# Patient Record
Sex: Female | Born: 1935 | Race: White | Hispanic: No | Smoking: Never smoker
Health system: Southern US, Community
[De-identification: ages and names within clinical notes are randomized; demographics above are authoritative.]

## PROBLEM LIST (undated history)

## (undated) DIAGNOSIS — E785 Hyperlipidemia, unspecified: Secondary | ICD-10-CM

## (undated) DIAGNOSIS — E039 Hypothyroidism, unspecified: Secondary | ICD-10-CM

## (undated) HISTORY — PX: ABDOMINAL HYSTERECTOMY: SHX81

---

## 2018-12-03 ENCOUNTER — Emergency Department (HOSPITAL_BASED_OUTPATIENT_CLINIC_OR_DEPARTMENT_OTHER)
Admission: EM | Admit: 2018-12-03 | Discharge: 2018-12-03 | Disposition: A | Discharge status: Home / Self Care | Payer: Medicare HMO | Attending: Emergency Medicine | Admitting: Emergency Medicine

## 2018-12-03 ENCOUNTER — Encounter (HOSPITAL_BASED_OUTPATIENT_CLINIC_OR_DEPARTMENT_OTHER): Payer: Self-pay | Admitting: Emergency Medicine

## 2018-12-03 ENCOUNTER — Other Ambulatory Visit: Payer: Self-pay

## 2018-12-03 DIAGNOSIS — L03114 Cellulitis of left upper limb: Secondary | ICD-10-CM | POA: Diagnosis not present

## 2018-12-03 DIAGNOSIS — M79602 Pain in left arm: Secondary | ICD-10-CM | POA: Diagnosis present

## 2018-12-03 LAB — LACTIC ACID, PLASMA: Lactic Acid, Venous: 1.2 mmol/L (ref 0.5–1.9)

## 2018-12-03 LAB — CBC WITH DIFFERENTIAL/PLATELET
Abs Immature Granulocytes: 0.03 10*3/uL (ref 0.00–0.07)
Basophils Absolute: 0 10*3/uL (ref 0.0–0.1)
Basophils Relative: 0 %
Eosinophils Absolute: 0.7 10*3/uL — ABNORMAL HIGH (ref 0.0–0.5)
Eosinophils Relative: 8 %
HCT: 39.7 % (ref 36.0–46.0)
Hemoglobin: 12.8 g/dL (ref 12.0–15.0)
Immature Granulocytes: 0 %
Lymphocytes Relative: 38 %
Lymphs Abs: 3.6 10*3/uL (ref 0.7–4.0)
MCH: 29.6 pg (ref 26.0–34.0)
MCHC: 32.2 g/dL (ref 30.0–36.0)
MCV: 91.7 fL (ref 80.0–100.0)
Monocytes Absolute: 1.2 10*3/uL — ABNORMAL HIGH (ref 0.1–1.0)
Monocytes Relative: 13 %
Neutro Abs: 3.9 10*3/uL (ref 1.7–7.7)
Neutrophils Relative %: 41 %
Platelets: 148 10*3/uL — ABNORMAL LOW (ref 150–400)
RBC: 4.33 MIL/uL (ref 3.87–5.11)
RDW: 14.4 % (ref 11.5–15.5)
WBC: 9.4 10*3/uL (ref 4.0–10.5)
nRBC: 0 % (ref 0.0–0.2)

## 2018-12-03 LAB — BASIC METABOLIC PANEL
Anion gap: 10 (ref 5–15)
BUN: 14 mg/dL (ref 8–23)
CO2: 25 mmol/L (ref 22–32)
Calcium: 8.7 mg/dL — ABNORMAL LOW (ref 8.9–10.3)
Chloride: 102 mmol/L (ref 98–111)
Creatinine, Ser: 1 mg/dL (ref 0.44–1.00)
GFR calc Af Amer: 58 mL/min — ABNORMAL LOW (ref >60)
GFR calc non Af Amer: 50 mL/min — ABNORMAL LOW (ref >60)
Glucose, Bld: 110 mg/dL — ABNORMAL HIGH (ref 70–99)
Potassium: 3.6 mmol/L (ref 3.5–5.1)
Sodium: 137 mmol/L (ref 135–145)

## 2018-12-03 MED ORDER — DOXYCYCLINE HYCLATE 100 MG PO TABS
100.0000 mg | ORAL_TABLET | Freq: Once | ORAL | Status: AC
  Administered 2018-12-03: 16:00:00 100 mg via ORAL
  Filled 2018-12-03: qty 1

## 2018-12-03 MED ORDER — DOXYCYCLINE HYCLATE 100 MG PO CAPS: 100.0000 mg | ORAL_CAPSULE | Freq: Two times a day (BID) | ORAL | 0 refills | Status: DC

## 2018-12-03 NOTE — Discharge Instructions (Addendum)
If you develop fever, vomiting, worsening or severe pain, or any other new/concerning symptoms then return to the ER for evaluation.  Otherwise follow-up closely with your primary care physician.

## 2018-12-03 NOTE — ED Notes (Signed)
ED Provider at bedside. 

## 2018-12-03 NOTE — ED Provider Notes (Signed)
[facility] EMERGENCY DEPARTMENT Provider Note   CSN: [id number] Arrival date & time: 12/03/18  1422    History   Chief Complaint Chief Complaint  Patient presents with  . Arm Injury    HPI Madison Chen is a 85 y.o. female.     HPI  85 year old female presents with left arm redness. She fell and injured her arm with an abrasion to her lower upper arm on the left side about a week ago.  Picked at a scab.  Over the last couple days has developed redness, increased warmth and itching.  She has noticed swelling as well.  It is not particularly painful unless palpated.  There is no fever or vomiting.  No chest pain or shortness of breath.  She put some Neosporin on it last night which she thinks might of helped.  History reviewed. No pertinent past medical history.  There are no active problems to display for this patient.   Past Surgical History:  Procedure Laterality Date  . ABDOMINAL HYSTERECTOMY       OB History   No obstetric history on file.      Home Medications    Prior to Admission medications   Medication Sig Start Date End Date Taking? Authorizing Provider  doxycycline (VIBRAMYCIN) 100 MG capsule Take 1 capsule (100 mg total) by mouth 2 (two) times daily. 12/03/18   Pricilla LovelessGoldston, Alastor Kneale, MD    Family History No family history on file.  Social History Social History   Tobacco Use  . Smoking status: Never Smoker  . Smokeless tobacco: Never Used  Substance Use Topics  . Alcohol use: Not Currently  . Drug use: Not on file     Allergies   Sulfa antibiotics   Review of Systems Review of Systems  Constitutional: Negative for fever.  Respiratory: Negative for shortness of breath.   Cardiovascular: Negative for chest pain.  Gastrointestinal: Negative for vomiting.  Neurological: Negative for weakness and numbness.  All other systems reviewed and are negative.    Physical Exam Updated Vital Signs BP 133/83 (BP Location: Right Arm)   Pulse 83    Temp 98.4 F (36.9 C) (Oral)   Resp 18   Ht 5\' 7"  (1.702 m)   Wt 56.7 kg   SpO2 99%   BMI 19.58 kg/m   Physical Exam Vitals signs and nursing note reviewed.  Constitutional:      Appearance: She is well-developed.  HENT:     Head: Normocephalic and atraumatic.     Right Ear: External ear normal.     Left Ear: External ear normal.     Nose: Nose normal.  Eyes:     General:        Right eye: No discharge.        Left eye: No discharge.  Cardiovascular:     Rate and Rhythm: Normal rate and regular rhythm.     Pulses:          Radial pulses are 2+ on the left side.     Heart sounds: Normal heart sounds.  Pulmonary:     Effort: Pulmonary effort is normal.     Breath sounds: Normal breath sounds.  Abdominal:     General: There is no distension.  Skin:    General: Skin is warm and dry.     Findings: Erythema present.     Comments: See picture. No significant tenderness. Mild warmth compared to other arm. Normal strength/sensation. Normal ROM of elbow,  wrist.  Neurological:     Mental Status: She is alert.  Psychiatric:        Mood and Affect: Mood is not anxious.        ED Treatments / Results  Labs (all labs ordered are listed, but only abnormal results are displayed) Labs Reviewed  BASIC METABOLIC PANEL - Abnormal; Notable for the following components:      Result Value   Glucose, Bld 110 (*)    Calcium 8.7 (*)    GFR calc non Af Amer 50 (*)    GFR calc Af Amer 58 (*)    All other components within normal limits  CBC WITH DIFFERENTIAL/PLATELET - Abnormal; Notable for the following components:   Platelets 148 (*)    Monocytes Absolute 1.2 (*)    Eosinophils Absolute 0.7 (*)    All other components within normal limits  LACTIC ACID, PLASMA    EKG None  Radiology No results found.  Procedures Procedures (including critical care time)  Medications Ordered in ED Medications  doxycycline (VIBRA-TABS) tablet 100 mg (100 mg Oral Given 12/03/18 1609)      Initial Impression / Assessment and Plan / ED Course  I have reviewed the triage vital signs and the nursing notes.  Pertinent labs & imaging results that were available during my care of the patient were reviewed by me and considered in my medical decision making (see chart for details).        Given the degree of redness and her age, screening labs obtained but did not show any signs of sepsis.  She will be given doxycycline to help cover for most likely pathogens.  Advised of strict return precautions and otherwise to follow-up with her PCP for wound recheck but otherwise appears stable for outpatient management.  Given the direct correlation from an abrasion/wound to this redness, I think this is most likely cellulitis.  I think DVT is pretty unlikely, especially given the demarcation.  Final Clinical Impressions(s) / ED Diagnoses   Final diagnoses:  Left arm cellulitis    ED Discharge Orders         Ordered    doxycycline (VIBRAMYCIN) 100 MG capsule  2 times daily     12/03/18 1610           Sherwood Gambler, MD 12/03/18 1611

## 2018-12-03 NOTE — ED Triage Notes (Signed)
She states she fell one week ago and had a skin tear to L upper arm. The skin tear healed and now has swelling, redness, and itching x 2 days.

## 2020-04-25 ENCOUNTER — Emergency Department (HOSPITAL_BASED_OUTPATIENT_CLINIC_OR_DEPARTMENT_OTHER): Payer: Medicare HMO

## 2020-04-25 ENCOUNTER — Encounter (HOSPITAL_BASED_OUTPATIENT_CLINIC_OR_DEPARTMENT_OTHER): Payer: Self-pay | Admitting: *Deleted

## 2020-04-25 ENCOUNTER — Emergency Department (HOSPITAL_BASED_OUTPATIENT_CLINIC_OR_DEPARTMENT_OTHER)
Admission: EM | Admit: 2020-04-25 | Discharge: 2020-04-25 | Disposition: A | Discharge status: Home / Self Care | Payer: Medicare HMO | Attending: Emergency Medicine | Admitting: Emergency Medicine

## 2020-04-25 ENCOUNTER — Other Ambulatory Visit: Payer: Self-pay

## 2020-04-25 DIAGNOSIS — W0110XA Fall on same level from slipping, tripping and stumbling with subsequent striking against unspecified object, initial encounter: Secondary | ICD-10-CM | POA: Insufficient documentation

## 2020-04-25 DIAGNOSIS — Y92009 Unspecified place in unspecified non-institutional (private) residence as the place of occurrence of the external cause: Secondary | ICD-10-CM | POA: Diagnosis not present

## 2020-04-25 DIAGNOSIS — S0101XA Laceration without foreign body of scalp, initial encounter: Secondary | ICD-10-CM

## 2020-04-25 DIAGNOSIS — S0990XA Unspecified injury of head, initial encounter: Secondary | ICD-10-CM | POA: Diagnosis present

## 2020-04-25 MED ORDER — LIDOCAINE-EPINEPHRINE 2 %-1:100000 IJ SOLN: 20.0000 mL | Freq: Once | INTRAMUSCULAR | Status: DC

## 2020-04-25 MED ORDER — LIDOCAINE-EPINEPHRINE 1 %-1:100000 IJ SOLN
INTRAMUSCULAR | Status: AC
  Administered 2020-04-25: 1 mL
  Filled 2020-04-25: qty 1

## 2020-04-25 NOTE — ED Notes (Signed)
Wound irrigation performed,  Pt tolerated well.  Dr Stevie Kern at bedside for laceration repair

## 2020-04-25 NOTE — Discharge Instructions (Addendum)
Follow-up with your primary doctor in 1 week for wound recheck and suture removal.  She may also return to ER for wound recheck and suture removal.  If you develop redness, drainage, fever, return to ER for wound recheck at that time.  If you develop severe headache, nausea, vomiting, confusion, additionally recommend return to ER.

## 2020-04-25 NOTE — ED Provider Notes (Signed)
[facility] EMERGENCY DEPARTMENT Provider Note   CSN: [id number] Arrival date & time: 04/25/20  1243     History Chief Complaint  Patient presents with  . Fall  . Laceration    Madison Chen is a 86 y.o. female.  Presents to ER with concern for fall, scalp laceration.  Patient reports that she was at home when she lost her balance and fell backwards hitting her head.  Unsure what she hit her head on.  Did not lose consciousness, denies any other injury.  No pain at present.  Notable medical history reports she had subdural hematoma 6 years ago.  Not on blood thinners.  HPI     History reviewed. No pertinent past medical history.  There are no problems to display for this patient.   Past Surgical History:  Procedure Laterality Date  . ABDOMINAL HYSTERECTOMY       OB History   No obstetric history on file.     No family history on file.  Social History   Tobacco Use  . Smoking status: Never Smoker  . Smokeless tobacco: Never Used  Substance Use Topics  . Alcohol use: Not Currently  . Drug use: Never    Home Medications Prior to Admission medications   Medication Sig Start Date End Date Taking? Authorizing Provider  doxycycline (VIBRAMYCIN) 100 MG capsule Take 1 capsule (100 mg total) by mouth 2 (two) times daily. 12/03/18   Pricilla Loveless, MD    Allergies    Sulfa antibiotics  Review of Systems   Review of Systems  Constitutional: Negative for chills and fever.  HENT: Negative for ear pain and sore throat.   Eyes: Negative for pain and visual disturbance.  Respiratory: Negative for cough and shortness of breath.   Cardiovascular: Negative for chest pain and palpitations.  Gastrointestinal: Negative for abdominal pain and vomiting.  Genitourinary: Negative for dysuria and hematuria.  Musculoskeletal: Negative for arthralgias and back pain.  Skin: Positive for wound. Negative for color change and rash.  Neurological: Negative for seizures,  syncope and headaches.  All other systems reviewed and are negative.   Physical Exam Updated Vital Signs BP (!) 141/75 (BP Location: Right Arm)   Pulse 70   Temp 98.8 F (37.1 C) (Oral)   Resp 18   Ht 5\' 7"  (1.702 m)   Wt 58.5 kg   SpO2 100%   BMI 20.20 kg/m   Physical Exam Vitals and nursing note reviewed.  Constitutional:      General: She is not in acute distress.    Appearance: She is well-developed and well-nourished.  HENT:     Head: Normocephalic.     Comments: Laceration to posterior occiput 3 cm in length, no active bleeding Eyes:     Conjunctiva/sclera: Conjunctivae normal.  Cardiovascular:     Rate and Rhythm: Normal rate and regular rhythm.     Heart sounds: No murmur heard.   Pulmonary:     Effort: Pulmonary effort is normal. No respiratory distress.     Breath sounds: Normal breath sounds.  Abdominal:     Palpations: Abdomen is soft.     Tenderness: There is no abdominal tenderness.  Musculoskeletal:        General: No swelling, tenderness, deformity, signs of injury or edema.     Cervical back: Neck supple.  Skin:    General: Skin is warm and dry.     Capillary Refill: Capillary refill takes less than 2 seconds.  Neurological:  General: No focal deficit present.     Mental Status: She is alert and oriented to person, place, and time.  Psychiatric:        Mood and Affect: Mood and affect normal.     ED Results / Procedures / Treatments   Labs (all labs ordered are listed, but only abnormal results are displayed) Labs Reviewed - No data to display  EKG None  Radiology No results found.  Procedures .Marland KitchenLaceration Repair  Date/Time: 04/26/2020 12:16 AM Performed by: Milagros Loll, MD Authorized by: Milagros Loll, MD   Consent:    Consent obtained:  Verbal   Consent given by:  Patient   Risks, benefits, and alternatives were discussed: yes     Risks discussed:  Infection, need for additional repair and nerve damage    Alternatives discussed:  No treatment Anesthesia:    Anesthesia method:  Local infiltration   Local anesthetic:  Lidocaine 2% WITH epi Laceration details:    Location:  Scalp   Scalp location:  Occipital   Length (cm):  3 Treatment:    Area cleansed with:  Saline   Amount of cleaning:  Extensive   Irrigation solution:  Sterile saline   Irrigation method:  Syringe   Visualized foreign bodies/material removed: no     Debridement:  None Skin repair:    Repair method:  Staples   Number of staples:  4 Approximation:    Approximation:  Close Repair type:    Repair type:  Simple Post-procedure details:    Dressing:  Open (no dressing)   Procedure completion:  Tolerated well, no immediate complications   (including critical care time)  Medications Ordered in ED Medications  lidocaine-EPINEPHrine (XYLOCAINE W/EPI) 2 %-1:100000 (with pres) injection 20 mL (20 mLs Infiltration Not Given 04/25/20 1651)  lidocaine-EPINEPHrine (XYLOCAINE W/EPI) 1 %-1:100000 (with pres) injection (1 mL  Given by Other 04/25/20 1651)    ED Course  I have reviewed the triage vital signs and the nursing notes.  Pertinent labs & imaging results that were available during my care of the patient were reviewed by me and considered in my medical decision making (see chart for details).    MDM Rules/Calculators/A&P                           86 year old lady fell and hit her head.  No loss of consciousness.  Well-appearing in ER.  Noted 3 cm laceration of scalp.  4 staples, tolerated well.  Follow-up in 1 week with primary doctor or return here for wound recheck and suture removal.  Final Clinical Impression(s) / ED Diagnoses Final diagnoses:  None    Rx / DC Orders ED Discharge Orders    None       Milagros Loll, MD 04/26/20 [phone]

## 2020-04-25 NOTE — ED Triage Notes (Signed)
She lost her balance and fell today. Scalp laceration. No LOC. No blood thinners. 6 years ago she had a subdural hematoma after hitting her head.

## 2020-04-25 NOTE — ED Notes (Signed)
Patient transported to CT 

## 2020-05-08 ENCOUNTER — Emergency Department (HOSPITAL_BASED_OUTPATIENT_CLINIC_OR_DEPARTMENT_OTHER): Payer: Medicare HMO

## 2020-05-08 ENCOUNTER — Encounter (HOSPITAL_BASED_OUTPATIENT_CLINIC_OR_DEPARTMENT_OTHER): Payer: Self-pay

## 2020-05-08 ENCOUNTER — Other Ambulatory Visit: Payer: Self-pay

## 2020-05-08 ENCOUNTER — Emergency Department (HOSPITAL_BASED_OUTPATIENT_CLINIC_OR_DEPARTMENT_OTHER)
Admission: EM | Admit: 2020-05-08 | Discharge: 2020-05-08 | Disposition: A | Discharge status: Home / Self Care | Payer: Medicare HMO | Attending: Emergency Medicine | Admitting: Emergency Medicine

## 2020-05-08 DIAGNOSIS — Z20822 Contact with and (suspected) exposure to covid-19: Secondary | ICD-10-CM

## 2020-05-08 DIAGNOSIS — R5383 Other fatigue: Secondary | ICD-10-CM | POA: Diagnosis present

## 2020-05-08 DIAGNOSIS — U071 COVID-19: Secondary | ICD-10-CM | POA: Insufficient documentation

## 2020-05-08 LAB — SARS CORONAVIRUS 2 (TAT 6-24 HRS): SARS Coronavirus 2: POSITIVE — AB

## 2020-05-08 NOTE — Discharge Instructions (Signed)
You were seen today in the emergency department with fever and fatigue.  We are sending a Covid test and this will come back in the next 6 to 24 hours.  The test results will be available in the MyChart app.  You can set this up on your phone with instructions included in this paperwork.  You would also be called with a positive test result.  If positive, the infusion center will reach out to discuss if you are a candidate to receive the monoclonal antibody therapy.  You can also discuss this with your primary care doctor.  Return to the emergency department any new or suddenly worsening symptoms.

## 2020-05-08 NOTE — ED Notes (Signed)
When RN went into room to obtain blood pt states she wanted a covid test and to go home. This RN spoke with pts son who stated he brought pt to ER to be tested. MD aware.

## 2020-05-08 NOTE — ED Triage Notes (Signed)
Pt reports fever and fatigue at home x 2-3 days.

## 2020-05-08 NOTE — ED Provider Notes (Signed)
Emergency Department Provider Note   I have reviewed the triage vital signs and the nursing notes.   HISTORY  Chief Complaint Fatigue   HPI Madison Chen is a 87 y.o. female with past medical history reviewed below including recent ED visit with head injury requiring laceration repair presents with fatigue, fevers for the past 3 days.  Patient describes mainly fever at night which improves with Tylenol.  She has been feeling very fatigued with some body aches.  She notes some mild posterior headache but states overall her symptoms after the fall have improved.  The wound has not been bleeding or draining.  She denies any shortness of breath, respiratory symptoms, sore throat, congestion.  No abdominal pain, vomiting, diarrhea.  Denies any dysuria, hesitancy, urgency.  No sick contacts.  History reviewed. No pertinent past medical history.  There are no problems to display for this patient.   Past Surgical History:  Procedure Laterality Date  . ABDOMINAL HYSTERECTOMY      Allergies Sulfa antibiotics  History reviewed. No pertinent family history.  Social History Social History   Tobacco Use  . Smoking status: Never Smoker  . Smokeless tobacco: Never Used  Substance Use Topics  . Alcohol use: Not Currently  . Drug use: Never    Review of Systems  Constitutional: Positive fever/chills Eyes: No visual changes. ENT: No sore throat. Cardiovascular: Denies chest pain. Respiratory: Denies shortness of breath. Gastrointestinal: No abdominal pain.  No nausea, no vomiting.  No diarrhea.  No constipation. Genitourinary: Negative for dysuria. Musculoskeletal: Negative for back pain. Skin: Negative for rash. Neurological: Negative for focal weakness or numbness. Mild posterior HA (improved)  10-point ROS otherwise negative.  ____________________________________________   PHYSICAL EXAM:  VITAL SIGNS: ED Triage Vitals  Enc Vitals Group     BP 05/08/20 1045 105/67      Pulse Rate 05/08/20 1045 95     Resp 05/08/20 1045 18     Temp 05/08/20 1045 97.8 F (36.6 C)     Temp Source 05/08/20 1045 Oral     SpO2 05/08/20 1045 97 %     Weight 05/08/20 1046 125 lb (56.7 kg)     Height 05/08/20 1046 5\' 6"  (1.676 m)   Constitutional: Alert and oriented. Well appearing and in no acute distress. Eyes: Conjunctivae are normal. PERRL Head: Well appearing laceration to the right occipital scalp.  No surrounding cellulitis.  No residual hematoma. Staples removed.  Nose: No congestion/rhinnorhea. Mouth/Throat: Mucous membranes are moist.  Neck: No stridor.   Cardiovascular: Normal rate, regular rhythm. Good peripheral circulation. Grossly normal heart sounds.   Respiratory: Normal respiratory effort.  No retractions. Lungs CTAB. Gastrointestinal: Soft and nontender. No distention.  Musculoskeletal: No lower extremity tenderness nor edema. No gross deformities of extremities. Neurologic:  Normal speech and language. No gross focal neurologic deficits are appreciated.  Skin:  Skin is warm, dry and intact. No rash noted.   ____________________________________________   LABS (all labs ordered are listed, but only abnormal results are displayed)  Labs Reviewed  SARS CORONAVIRUS 2 (TAT 6-24 HRS) - Abnormal; Notable for the following components:      Result Value   SARS Coronavirus 2 POSITIVE (*)    All other components within normal limits   ____________________________________________   PROCEDURES  Procedure(s) performed:   Procedures  None  ____________________________________________   INITIAL IMPRESSION / ASSESSMENT AND PLAN / ED COURSE  Pertinent labs & imaging results that were available during my care of the patient  were reviewed by me and considered in my medical decision making (see chart for details).   Patient presents to the emergency department with fatigue and fever.  The wound on her scalp is very well-appearing and is healing as I would  expect.  Does not appear infected.  Patient has a normal neurologic exam.  She sitting on the edge of the bed and conversational without obvious confusion.  My suspicion for delayed bleed causing symptoms in this case is exceedingly low and I do not feel she needs additional CT imaging of the head at this time.  COVID-19 is a consideration along with other viral infections.  Vital signs are not consistent with sepsis.  Plan for screening lab work, UA, chest x-ray, Covid test, and reassess.   After further discussion, patient refusing additional lab work.  She states that she is mainly concerned about COVID-19 given possible exposure.  She does not want to have blood or urine testing.  Refusing chest x-ray.  We will swab for Covid and have her follow the test results in the MyChart app.  Discussed that if she feels any worse she should return to the emergency department any new or suddenly worsening symptoms.  ____________________________________________  FINAL CLINICAL IMPRESSION(S) / ED DIAGNOSES  Final diagnoses:  Suspected COVID-19 virus infection     Note:  This document was prepared using Dragon voice recognition software and may include unintentional dictation errors.  Alona Bene, MD, [facility]    Benett Swoyer, Arlyss Repress, MD 05/10/20 [phone]

## 2020-08-19 ENCOUNTER — Other Ambulatory Visit: Payer: Self-pay

## 2020-08-19 ENCOUNTER — Emergency Department (HOSPITAL_BASED_OUTPATIENT_CLINIC_OR_DEPARTMENT_OTHER): Payer: Medicare HMO

## 2020-08-19 ENCOUNTER — Inpatient Hospital Stay (HOSPITAL_BASED_OUTPATIENT_CLINIC_OR_DEPARTMENT_OTHER)
Admission: EM | Admit: 2020-08-19 | Discharge: 2020-08-24 | DRG: 193 | Disposition: A | Discharge status: Skilled Nursing Facility | Payer: Medicare HMO | Attending: Internal Medicine | Admitting: Internal Medicine

## 2020-08-19 ENCOUNTER — Encounter (HOSPITAL_BASED_OUTPATIENT_CLINIC_OR_DEPARTMENT_OTHER): Payer: Self-pay | Admitting: Radiology

## 2020-08-19 DIAGNOSIS — Z87440 Personal history of urinary (tract) infections: Secondary | ICD-10-CM | POA: Diagnosis not present

## 2020-08-19 DIAGNOSIS — R6520 Severe sepsis without septic shock: Secondary | ICD-10-CM | POA: Diagnosis not present

## 2020-08-19 DIAGNOSIS — E039 Hypothyroidism, unspecified: Secondary | ICD-10-CM

## 2020-08-19 DIAGNOSIS — E785 Hyperlipidemia, unspecified: Secondary | ICD-10-CM

## 2020-08-19 DIAGNOSIS — R531 Weakness: Secondary | ICD-10-CM

## 2020-08-19 DIAGNOSIS — Z888 Allergy status to other drugs, medicaments and biological substances status: Secondary | ICD-10-CM

## 2020-08-19 DIAGNOSIS — E876 Hypokalemia: Secondary | ICD-10-CM | POA: Diagnosis present

## 2020-08-19 DIAGNOSIS — N179 Acute kidney failure, unspecified: Secondary | ICD-10-CM

## 2020-08-19 DIAGNOSIS — R410 Disorientation, unspecified: Secondary | ICD-10-CM | POA: Diagnosis not present

## 2020-08-19 DIAGNOSIS — Z882 Allergy status to sulfonamides status: Secondary | ICD-10-CM | POA: Diagnosis not present

## 2020-08-19 DIAGNOSIS — J189 Pneumonia, unspecified organism: Secondary | ICD-10-CM

## 2020-08-19 DIAGNOSIS — D696 Thrombocytopenia, unspecified: Secondary | ICD-10-CM | POA: Diagnosis present

## 2020-08-19 DIAGNOSIS — Z20822 Contact with and (suspected) exposure to covid-19: Secondary | ICD-10-CM | POA: Diagnosis present

## 2020-08-19 DIAGNOSIS — A419 Sepsis, unspecified organism: Secondary | ICD-10-CM | POA: Diagnosis not present

## 2020-08-19 DIAGNOSIS — G9341 Metabolic encephalopathy: Secondary | ICD-10-CM

## 2020-08-19 DIAGNOSIS — D649 Anemia, unspecified: Secondary | ICD-10-CM | POA: Diagnosis present

## 2020-08-19 DIAGNOSIS — Z79890 Hormone replacement therapy (postmenopausal): Secondary | ICD-10-CM | POA: Diagnosis not present

## 2020-08-19 DIAGNOSIS — Z90710 Acquired absence of both cervix and uterus: Secondary | ICD-10-CM | POA: Diagnosis not present

## 2020-08-19 DIAGNOSIS — R7989 Other specified abnormal findings of blood chemistry: Secondary | ICD-10-CM | POA: Diagnosis present

## 2020-08-19 DIAGNOSIS — J180 Bronchopneumonia, unspecified organism: Secondary | ICD-10-CM | POA: Diagnosis present

## 2020-08-19 DIAGNOSIS — M609 Myositis, unspecified: Secondary | ICD-10-CM | POA: Diagnosis present

## 2020-08-19 DIAGNOSIS — M6282 Rhabdomyolysis: Secondary | ICD-10-CM | POA: Diagnosis present

## 2020-08-19 DIAGNOSIS — N390 Urinary tract infection, site not specified: Secondary | ICD-10-CM

## 2020-08-19 DIAGNOSIS — Z8616 Personal history of COVID-19: Secondary | ICD-10-CM | POA: Diagnosis not present

## 2020-08-19 DIAGNOSIS — R8281 Pyuria: Secondary | ICD-10-CM | POA: Diagnosis not present

## 2020-08-19 DIAGNOSIS — E860 Dehydration: Secondary | ICD-10-CM | POA: Diagnosis present

## 2020-08-19 DIAGNOSIS — R8271 Bacteriuria: Secondary | ICD-10-CM | POA: Diagnosis not present

## 2020-08-19 HISTORY — DX: Hypothyroidism, unspecified: E03.9

## 2020-08-19 HISTORY — DX: Hyperlipidemia, unspecified: E78.5

## 2020-08-19 LAB — CBC WITH DIFFERENTIAL/PLATELET
Abs Immature Granulocytes: 0.26 10*3/uL — ABNORMAL HIGH (ref 0.00–0.07)
Basophils Absolute: 0.1 10*3/uL (ref 0.0–0.1)
Basophils Relative: 0 %
Eosinophils Absolute: 0.1 10*3/uL (ref 0.0–0.5)
Eosinophils Relative: 1 %
HCT: 38.6 % (ref 36.0–46.0)
Hemoglobin: 13.1 g/dL (ref 12.0–15.0)
Immature Granulocytes: 1 %
Lymphocytes Relative: 6 %
Lymphs Abs: 1.8 10*3/uL (ref 0.7–4.0)
MCH: 30.6 pg (ref 26.0–34.0)
MCHC: 33.9 g/dL (ref 30.0–36.0)
MCV: 90.2 fL (ref 80.0–100.0)
Monocytes Absolute: 0.8 10*3/uL (ref 0.1–1.0)
Monocytes Relative: 3 %
Neutro Abs: 24.9 10*3/uL — ABNORMAL HIGH (ref 1.7–7.7)
Neutrophils Relative %: 89 %
Platelets: 103 10*3/uL — ABNORMAL LOW (ref 150–400)
RBC: 4.28 MIL/uL (ref 3.87–5.11)
RDW: 14.8 % (ref 11.5–15.5)
Smear Review: DECREASED
WBC Morphology: INCREASED
WBC: 28 10*3/uL — ABNORMAL HIGH (ref 4.0–10.5)
nRBC: 0 % (ref 0.0–0.2)

## 2020-08-19 LAB — COMPREHENSIVE METABOLIC PANEL
ALT: 36 U/L (ref 0–44)
AST: 66 U/L — ABNORMAL HIGH (ref 15–41)
Albumin: 3.6 g/dL (ref 3.5–5.0)
Alkaline Phosphatase: 89 U/L (ref 38–126)
Anion gap: 12 (ref 5–15)
BUN: 28 mg/dL — ABNORMAL HIGH (ref 8–23)
CO2: 23 mmol/L (ref 22–32)
Calcium: 8.6 mg/dL — ABNORMAL LOW (ref 8.9–10.3)
Chloride: 98 mmol/L (ref 98–111)
Creatinine, Ser: 1.51 mg/dL — ABNORMAL HIGH (ref 0.44–1.00)
GFR, Estimated: 32 mL/min — ABNORMAL LOW (ref >60)
Glucose, Bld: 218 mg/dL — ABNORMAL HIGH (ref 70–99)
Potassium: 3.5 mmol/L (ref 3.5–5.1)
Sodium: 133 mmol/L — ABNORMAL LOW (ref 135–145)
Total Bilirubin: 0.9 mg/dL (ref 0.3–1.2)
Total Protein: 6.8 g/dL (ref 6.5–8.1)

## 2020-08-19 LAB — URINALYSIS, ROUTINE W REFLEX MICROSCOPIC
Bilirubin Urine: NEGATIVE
Glucose, UA: NEGATIVE mg/dL
Ketones, ur: NEGATIVE mg/dL
Nitrite: NEGATIVE
Protein, ur: 30 mg/dL — AB
Specific Gravity, Urine: 1.02 (ref 1.005–1.030)
pH: 6 (ref 5.0–8.0)

## 2020-08-19 LAB — URINALYSIS, MICROSCOPIC (REFLEX): WBC, UA: >50 WBC/hpf (ref 0–5)

## 2020-08-19 LAB — MAGNESIUM: Magnesium: 2 mg/dL (ref 1.7–2.4)

## 2020-08-19 LAB — TROPONIN I (HIGH SENSITIVITY)
Troponin I (High Sensitivity): 31 ng/L — ABNORMAL HIGH (ref <18)
Troponin I (High Sensitivity): 26 ng/L — ABNORMAL HIGH (ref <18)

## 2020-08-19 LAB — CK: Total CK: 1024 U/L — ABNORMAL HIGH (ref 38–234)

## 2020-08-19 MED ORDER — SODIUM CHLORIDE 0.9 % IV BOLUS
30.0000 mL/kg | Freq: Once | INTRAVENOUS | Status: AC
Start: 2020-08-19 — End: 2020-08-19
  Administered 2020-08-19: 1207 mL via INTRAVENOUS

## 2020-08-19 MED ORDER — SODIUM CHLORIDE 0.9 % IV SOLN
2.0000 g | INTRAVENOUS | Status: DC
  Administered 2020-08-20 – 2020-08-21 (×2): 2 g via INTRAVENOUS
  Filled 2020-08-19: qty 2
  Filled 2020-08-19 (×2): qty 20

## 2020-08-19 MED ORDER — DOXYCYCLINE HYCLATE 100 MG PO TABS
100.0000 mg | ORAL_TABLET | Freq: Once | ORAL | Status: AC
  Administered 2020-08-19: 100 mg via ORAL
  Filled 2020-08-19: qty 1

## 2020-08-19 MED ORDER — SODIUM CHLORIDE 0.9 % IV BOLUS
500.0000 mL | Freq: Once | INTRAVENOUS | Status: AC
  Administered 2020-08-19: 500 mL via INTRAVENOUS

## 2020-08-19 MED ORDER — SODIUM CHLORIDE 0.9 % IV SOLN: INTRAVENOUS | Status: DC

## 2020-08-19 MED ORDER — SODIUM CHLORIDE 0.9 % IV SOLN
1.0000 g | Freq: Once | INTRAVENOUS | Status: AC
  Administered 2020-08-19: 1 g via INTRAVENOUS
  Filled 2020-08-19: qty 10

## 2020-08-19 MED ORDER — IOHEXOL 300 MG/ML  SOLN
100.0000 mL | Freq: Once | INTRAMUSCULAR | Status: AC | PRN
  Administered 2020-08-19: 80 mL via INTRAVENOUS

## 2020-08-19 MED ORDER — ACETAMINOPHEN 325 MG PO TABS
650.0000 mg | ORAL_TABLET | Freq: Four times a day (QID) | ORAL | Status: DC | PRN
  Administered 2020-08-20 – 2020-08-22 (×3): 650 mg via ORAL
  Filled 2020-08-19 (×4): qty 2

## 2020-08-19 MED ORDER — SODIUM CHLORIDE 0.9 % IV SOLN
100.0000 mg | Freq: Two times a day (BID) | INTRAVENOUS | Status: DC
  Administered 2020-08-19 – 2020-08-22 (×6): 100 mg via INTRAVENOUS
  Filled 2020-08-19 (×6): qty 100

## 2020-08-19 MED ORDER — ACETAMINOPHEN 650 MG RE SUPP: 650.0000 mg | Freq: Four times a day (QID) | RECTAL | Status: DC | PRN

## 2020-08-19 NOTE — ED Notes (Signed)
Son Mellody Dance: [id number]

## 2020-08-19 NOTE — ED Provider Notes (Signed)
[facility] EMERGENCY DEPARTMENT Provider Note   CSN: [id number] Arrival date & time: 08/19/20  1044     History Chief Complaint  Patient presents with  . Weakness  . Altered Mental Status    Madison Chen is a 87 y.o. female.  Madison Chen was diagnosed with COVID-19 in February of this year.  Madison Chen had a left-sided lung infiltrate.  Madison Chen had an equivocal urinalysis and was treated for a possible UTI.  Symptoms today are similar to those Madison Chen had when diagnosed with COVID-19 earlier this year.  The history is provided by the patient and a relative (son).  Weakness Severity:  Moderate Onset quality:  Gradual Duration:  3 days Timing:  Constant Progression:  Worsening Chronicity:  New Context: not change in medication and not recent infection   Relieved by:  Nothing Worsened by:  Nothing Ineffective treatments:  None tried Associated symptoms: difficulty walking (due to weakness)   Associated symptoms: no abdominal pain, no anorexia, no aphasia, no arthralgias, no ataxia, no chest pain, no cough, no diarrhea, no dizziness, no dysuria, no fever, no seizures, no shortness of breath, no stroke symptoms, no urgency, no vision change and no vomiting   Altered Mental Status Associated symptoms: weakness   Associated symptoms: no abdominal pain, no fever, no palpitations, no rash, no seizures, no visual change and no vomiting        No past medical history on file.  There are no problems to display for this patient.   Past Surgical History:  Procedure Laterality Date  . ABDOMINAL HYSTERECTOMY       OB History   No obstetric history on file.     No family history on file.  Social History   Tobacco Use  . Smoking status: Never Smoker  . Smokeless tobacco: Never Used  Substance Use Topics  . Alcohol use: Not Currently  . Drug use: Never    Home Medications Prior to Admission medications   Medication Sig Start Date End Date Taking? Authorizing Provider   atorvastatin (LIPITOR) 10 MG tablet Take 1 tablet by mouth daily. 08/15/20   [provider]  doxycycline (VIBRAMYCIN) 100 MG capsule Take 1 capsule (100 mg total) by mouth 2 (two) times daily. 12/03/18   Pricilla Loveless, MD    Allergies    Sulfa antibiotics  Review of Systems   Review of Systems  Constitutional: Negative for chills and fever.  HENT: Negative for ear pain and sore throat.   Eyes: Negative for pain and visual disturbance.  Respiratory: Negative for cough and shortness of breath.   Cardiovascular: Negative for chest pain and palpitations.  Gastrointestinal: Negative for abdominal pain, anorexia, diarrhea and vomiting.  Genitourinary: Negative for dysuria, hematuria and urgency.  Musculoskeletal: Negative for arthralgias and back pain.  Skin: Negative for color change and rash.  Neurological: Positive for weakness. Negative for dizziness, seizures and syncope.       Confusion: some forgetfulness over the past few days in regards to short term memory  All other systems reviewed and are negative.   Physical Exam Updated Vital Signs BP (!) 101/59 (BP Location: Left Arm)   Pulse 80   Temp 97.8 F (36.6 C) (Oral)   Resp 15   Ht 5\' 6"  (1.676 m)   Wt 56.8 kg   SpO2 96%   BMI 20.21 kg/m   Physical Exam Vitals and nursing note reviewed.  Constitutional:      General: Madison Chen is not in acute distress.  Appearance: Madison Chen is well-developed.  HENT:     Head: Normocephalic and atraumatic.  Eyes:     Conjunctiva/sclera: Conjunctivae normal.  Cardiovascular:     Rate and Rhythm: Normal rate and regular rhythm.     Heart sounds: No murmur heard.   Pulmonary:     Effort: Pulmonary effort is normal. No respiratory distress.     Breath sounds: Normal breath sounds.  Abdominal:     Palpations: Abdomen is soft.     Tenderness: There is no abdominal tenderness.  Musculoskeletal:     Cervical back: Neck supple.  Skin:    General: Skin is warm and dry.   Neurological:     Mental Status: Madison Chen is alert.     ED Results / Procedures / Treatments   Labs (all labs ordered are listed, but only abnormal results are displayed) Labs Reviewed  COMPREHENSIVE METABOLIC PANEL - Abnormal; Notable for the following components:      Result Value   Sodium 133 (*)    Glucose, Bld 218 (*)    BUN 28 (*)    Creatinine, Ser 1.51 (*)    Calcium 8.6 (*)    AST 66 (*)    GFR, Estimated 32 (*)    All other components within normal limits  CBC WITH DIFFERENTIAL/PLATELET - Abnormal; Notable for the following components:   WBC 28.0 (*)    Platelets 103 (*)    Neutro Abs 24.9 (*)    Abs Immature Granulocytes 0.26 (*)    All other components within normal limits  URINALYSIS, ROUTINE W REFLEX MICROSCOPIC - Abnormal; Notable for the following components:   APPearance TURBID (*)    Hgb urine dipstick SMALL (*)    Protein, ur 30 (*)    Leukocytes,Ua LARGE (*)    All other components within normal limits  URINALYSIS, MICROSCOPIC (REFLEX) - Abnormal; Notable for the following components:   Bacteria, UA MANY (*)    All other components within normal limits  TROPONIN I (HIGH SENSITIVITY) - Abnormal; Notable for the following components:   Troponin I (High Sensitivity) 31 (*)    All other components within normal limits  TROPONIN I (HIGH SENSITIVITY) - Abnormal; Notable for the following components:   Troponin I (High Sensitivity) 26 (*)    All other components within normal limits    EKG EKG Interpretation  Date/Time:  Monday August 19 2020 11:27:14 EDT Ventricular Rate:  85 PR Interval:  143 QRS Duration: 92 QT Interval:  392 QTC Calculation: 467 R Axis:   6 Text Interpretation: Sinus rhythm Atrial premature complexes normal axis no acute ischemia No prior for comparison Confirmed by Pieter Partridge ([id number]) on 08/19/2020 11:42:46 AM   Radiology DG Chest 2 View  Result Date: 08/19/2020 CLINICAL DATA:  Rule out pneumonia EXAM: CHEST - 2 VIEW COMPARISON:   None. FINDINGS: Patchy infiltrate in the lingula consistent with pneumonia. No significant pleural effusion. Right lung is clear COPD with hyperinflation of the lungs. IMPRESSION: COPD with pneumonia in the lingula. Electronically Signed   By: Marlan Palau M.D.   On: 08/19/2020 12:02   CT Head Wo Contrast  Result Date: 08/19/2020 CLINICAL DATA:  Altered mental status. EXAM: CT HEAD WITHOUT CONTRAST TECHNIQUE: Contiguous axial images were obtained from the base of the skull through the vertex without intravenous contrast. COMPARISON:  April 25, 2020. FINDINGS: Brain: Mild chronic ischemic white matter disease is noted. No mass effect or midline shift is noted. Ventricular size is within normal limits. There  is no evidence of mass lesion, hemorrhage or acute infarction. Vascular: No hyperdense vessel or unexpected calcification. Skull: Bilateral craniotomies are again noted. No acute osseous abnormality is noted. Sinuses/Orbits: No acute finding. Other: None. IMPRESSION: Mild chronic ischemic white matter disease. No acute intracranial abnormality seen. Electronically Signed   By: Lupita Raider M.D.   On: 08/19/2020 11:51   CT Chest W Contrast  Result Date: 08/19/2020 CLINICAL DATA:  Abdominal pain, fever, confusion and weakness. EXAM: CT CHEST, ABDOMEN, AND PELVIS WITH CONTRAST TECHNIQUE: Multidetector CT imaging of the chest, abdomen and pelvis was performed following the standard protocol during bolus administration of intravenous contrast. CONTRAST:  69mL OMNIPAQUE IOHEXOL 300 MG/ML  SOLN COMPARISON:  Chest radiography same day FINDINGS: CT CHEST FINDINGS Cardiovascular: Heart size is normal. No pericardial fluid. Some coronary artery calcification and aortic atherosclerotic calcification is noted. No sign of large central pulmonary emboli. Mediastinum/Nodes: No mediastinal or hilar mass or lymphadenopathy. Normal size nodes. Lungs/Pleura: The right lung shows some pleural and parenchymal scarring  at the apex and linear scarring at the base but is otherwise clear. No pleural fluid on the right. On the left, there is patchy infiltrate in the lingula and to a lesser extent in the left lower lobe consistent with bronchopneumonia. Follow-up to clearing is recommended. No pleural effusion. Musculoskeletal: Negative CT ABDOMEN PELVIS FINDINGS Hepatobiliary: 2 cm cyst in the lateral aspect of the right lobe of the liver. No other liver finding. Multiple calcified and noncalcified gallstones in the gallbladder. No CT evidence of cholecystitis or obstruction. Pancreas: Normal Spleen: Normal Adrenals/Urinary Tract: Adrenal glands are normal. Kidneys are normal. No mass, stone or hydronephrosis. No sign of pyelonephritis. The bladder appears unremarkable. Stomach/Bowel: Stomach and small intestine are normal. The appendix is normal. Diverticulosis of the colon of without visible diverticulitis. Vascular/Lymphatic: Aortic atherosclerosis. No aneurysm. IVC is normal. No retroperitoneal adenopathy. Reproductive: Previous hysterectomy.  No pelvic mass peer Other: No free fluid or air. Musculoskeletal: Ordinary lower lumbar degenerative changes. Ordinary osteoarthritis of the hips. IMPRESSION: 1. Patchy infiltrate in the lingula and to a lesser extent in the left lower lobe consistent with bronchopneumonia. Follow-up to clearing is recommended. 2. No acute abdominal or pelvic finding. 3. Aortic atherosclerosis. Coronary artery calcification. 4. Cholelithiasis without CT evidence of cholecystitis or obstruction. 5. Diverticulosis without evidence of diverticulitis. Aortic Atherosclerosis (ICD10-I70.0). Electronically Signed   By: Paulina Fusi M.D.   On: 08/19/2020 14:35   CT ABDOMEN PELVIS W CONTRAST  Result Date: 08/19/2020 CLINICAL DATA:  Abdominal pain, fever, confusion and weakness. EXAM: CT CHEST, ABDOMEN, AND PELVIS WITH CONTRAST TECHNIQUE: Multidetector CT imaging of the chest, abdomen and pelvis was performed  following the standard protocol during bolus administration of intravenous contrast. CONTRAST:  32mL OMNIPAQUE IOHEXOL 300 MG/ML  SOLN COMPARISON:  Chest radiography same day FINDINGS: CT CHEST FINDINGS Cardiovascular: Heart size is normal. No pericardial fluid. Some coronary artery calcification and aortic atherosclerotic calcification is noted. No sign of large central pulmonary emboli. Mediastinum/Nodes: No mediastinal or hilar mass or lymphadenopathy. Normal size nodes. Lungs/Pleura: The right lung shows some pleural and parenchymal scarring at the apex and linear scarring at the base but is otherwise clear. No pleural fluid on the right. On the left, there is patchy infiltrate in the lingula and to a lesser extent in the left lower lobe consistent with bronchopneumonia. Follow-up to clearing is recommended. No pleural effusion. Musculoskeletal: Negative CT ABDOMEN PELVIS FINDINGS Hepatobiliary: 2 cm cyst in the lateral aspect of the  right lobe of the liver. No other liver finding. Multiple calcified and noncalcified gallstones in the gallbladder. No CT evidence of cholecystitis or obstruction. Pancreas: Normal Spleen: Normal Adrenals/Urinary Tract: Adrenal glands are normal. Kidneys are normal. No mass, stone or hydronephrosis. No sign of pyelonephritis. The bladder appears unremarkable. Stomach/Bowel: Stomach and small intestine are normal. The appendix is normal. Diverticulosis of the colon of without visible diverticulitis. Vascular/Lymphatic: Aortic atherosclerosis. No aneurysm. IVC is normal. No retroperitoneal adenopathy. Reproductive: Previous hysterectomy.  No pelvic mass peer Other: No free fluid or air. Musculoskeletal: Ordinary lower lumbar degenerative changes. Ordinary osteoarthritis of the hips. IMPRESSION: 1. Patchy infiltrate in the lingula and to a lesser extent in the left lower lobe consistent with bronchopneumonia. Follow-up to clearing is recommended. 2. No acute abdominal or pelvic  finding. 3. Aortic atherosclerosis. Coronary artery calcification. 4. Cholelithiasis without CT evidence of cholecystitis or obstruction. 5. Diverticulosis without evidence of diverticulitis. Aortic Atherosclerosis (ICD10-I70.0). Electronically Signed   By: Paulina Fusi M.D.   On: 08/19/2020 14:35    Procedures .Critical Care Performed by: Koleen Distance, MD Authorized by: Koleen Distance, MD   Critical care provider statement:    Critical care time (minutes):  45   Critical care time was exclusive of:  Separately billable procedures and treating other patients and teaching time   Critical care was necessary to treat or prevent imminent or life-threatening deterioration of the following conditions:  Circulatory failure, CNS failure or compromise, sepsis, shock and renal failure   Critical care was time spent personally by me on the following activities:  Discussions with consultants, evaluation of patient's response to treatment, examination of patient, ordering and performing treatments and interventions, ordering and review of laboratory studies, ordering and review of radiographic studies, pulse oximetry, re-evaluation of patient's condition, obtaining history from patient or surrogate and review of old charts   I assumed direction of critical care for this patient from another provider in my specialty: no     Care discussed with: admitting provider       Medications Ordered in ED Medications  sodium chloride 0.9 % bolus 500 mL (0 mLs Intravenous Stopped 08/19/20 1345)  cefTRIAXone (ROCEPHIN) 1 g in sodium chloride 0.9 % 100 mL IVPB (0 g Intravenous Stopped 08/19/20 1323)  doxycycline (VIBRA-TABS) tablet 100 mg (100 mg Oral Given 08/19/20 1243)  iohexol (OMNIPAQUE) 300 MG/ML solution 100 mL (80 mLs Intravenous Contrast Given 08/19/20 1402)  sodium chloride 0.9 % bolus 1,704 mL (0 mLs Intravenous Stopped 08/19/20 1531)    ED Course  I have reviewed the triage vital signs and the nursing  notes.  Pertinent labs & imaging results that were available during my care of the patient were reviewed by me and considered in my medical decision making (see chart for details).  Clinical Course as of 08/19/20 1624  Mon Aug 19, 2020  1623 I spoke to Dr. Loney Hering who will admit the patient. [AW]    Clinical Course User Index [AW] Koleen Distance, MD   MDM Rules/Calculators/A&P                          Madison Chen is 87 years old and lives at home alone.  For the past few days Madison Chen has become fatigued, weak, and had intermittent confusion.  When Madison Chen arrived in the ED Madison Chen was mildly hypotensive and had periods of more severe hypotension that were responsive to fluid.  Madison Chen did not require  pressors.  ED evaluation was significant for pneumonia, and elevated white count, and a urinary tract infection.  Madison Chen was treated with Rocephin and doxycycline.  Due to the elevation in Madison Chen white count, Madison Chen delirium, and Madison Chen age, I think Madison Chen will be best served by inpatient admission. Final Clinical Impression(s) / ED Diagnoses Final diagnoses:  Delirium  AKI (acute kidney injury) (HCC)  Community acquired pneumonia of left lower lobe of lung    Rx / DC Orders ED Discharge Orders    None       Koleen DistanceWright, Marsia Cino G, MD 08/19/20 1624

## 2020-08-19 NOTE — ED Notes (Signed)
Report given to [place] at [facility]. [facility] at bedside to transport patient to [facility]. Son called and notified of status and room number. No acute distress noted.

## 2020-08-19 NOTE — ED Triage Notes (Signed)
States she was treated for a UTI recently. She is here today with confusion and weakness.

## 2020-08-19 NOTE — H&P (Signed)
History and Physical    PLEASE NOTE THAT DRAGON DICTATION SOFTWARE WAS USED IN THE CONSTRUCTION OF THIS NOTE.   Madison Chen MMN:[id number] DOB: Dec 11, 1927 DOA: 08/19/2020  PCP: Beckie Salts, MD Patient coming from: home   I have personally briefly reviewed patient's old medical records in [place]  Chief Complaint: altered mental status  HPI: Madison Chen is a 87 y.o. female with medical history significant for hyperlipidemia, acquired hypothyroidism who is admitted to [facility] on 08/19/2020 by way of transfer from [place] ED with acute metabolic encephalopathy in setting of suspected pneumonia and urinary tract infection after presenting from home to the latter facility for further evaluation of altered mental status.  In the setting of the patient's presenting acute encephalopathy, following history is provided by the ED physician and via chart review. Patient reportedly has been confused relative to baseline mental status over the last 2 to 3 days, and demonstrated mild nonproductive cough over that timeframe, which is reportedly new for her.  Not associate with any subjective fever. No recent trauma.   This has been associated with generalized weakness over the last 2-3 days, without the patient reporting any acute focal weakness.  She denies any recent chest pain, shortness of breath, diaphoresis, palpitations, nausea, vomiting.   The patient reportedly has a history of prior urinary tract infection as well as pneumonia, and has reportedly presented in similar fashion with confusion and generalized weakness in the setting of these prior acute infections. Of note, the patient was diagnosed with COVID-19 in January 2022, with positive test result on 05/08/2020.  No known history of chronic pulmonary pathology and no history of chronic supplemental oxygen requirements. No recent traveling.   In the setting of thea above symptoms, the patient presented to [facility] ED for  further evaluation.      [facility] ED Course:  Vital signs in the ED were notable for the following: Temperature max 98.4; heart rate initially 94, which decreased to 64-83 Davie Sagona administration of IV fluids, as further described below; initial blood pressure 88/50, which increased to 107/58 following interval IV fluids; respiratory rate 15-22, oxygen saturation 94 to 96% on room air.  Labs were notable for the following: CMP was notable for the following: Sodium 133 compared to 134 on 06/17/2020, chloride 98, bicarbonate 23 BUN 28, creatinine 1.51 relative to 0.82 on 06/17/2020, glucose 218.  CBC notable for white blood cell count of 28,000 with 89% neutrophils.  High-sensitivity troponin I initially found to be 31, 3 view value trending down to 26.  Urinalysis notable for greater than 50 white blood cells, many bacteria, large leukocyte esterase, no squamous epithelial cells, small hemoglobin, no RBCs, was positive for 30 protein.  Nasopharyngeal COVID-19 PCR was performed at [facility] this evening, with result currently pending.  EKG showed SR with PAC, and heart rate 85, normal intervals, no evidence of T wave changes or ST changes, including no evidence of ST elevation.  Chest x-ray showed infiltrate in the lingula consistent with pneumonia, in the absence of evidence of edema, pleural effusion, or pneumothorax.  CT chest with contrast also sick also showed infiltrate in the lingula as well as infiltrate in the left lower lobe consistent with bronchopneumonia, but otherwise, showed no evidence of acute cardiopulmonary process.  CT abdomen/pelvis showed no evidence of acute intra-abdominal process.  Noncontrast CT the head showed no evidence of acute intracranial process.  While in the ED, the following were administered: Doxycycline  x1 dose, Rocephin x1 dose, normal saline x2.2 L bolus followed by continuous normal saline at 125 cc/h.  Subsequently, the patient was transferred from [facility] ED to [facility] for further evaluation and management of acute encephalopathy in the setting of suspected community-acquired pneumonia and urinary tract infection.     Review of Systems: As per HPI otherwise 10 point review of systems negative.   Past Medical History:  Diagnosis Date  . HLD (hyperlipidemia)   . Hypothyroid     Past Surgical History:  Procedure Laterality Date  . ABDOMINAL HYSTERECTOMY      Social History:  reports that she has never smoked. She has never used smokeless tobacco. She reports previous alcohol use. She reports that she does not use drugs.   Allergies  Allergen Reactions  . Alendronate     Other reaction(s): Other  . Ibandronic Acid     Other reaction(s): Other  . Sulfa Antibiotics     Family history reviewed and not pertinent    Prior to Admission medications   Medication Sig Start Date End Date Taking? Authorizing Provider  atorvastatin (LIPITOR) 10 MG tablet Take 1 tablet by mouth daily. 08/15/20  Yes [provider]  Cholecalciferol 25 MCG (1000 UT) capsule Take 1,000 Units by mouth daily. 07/17/11  Yes [provider]  Ferrous Sulfate Dried 143 (45 Fe) MG TBCR Take 143 mg by mouth daily. 02/21/18  Yes [provider]  glucosamine-chondroitin 500-400 MG tablet Take 1 tablet by mouth daily.   Yes [provider]  levothyroxine (SYNTHROID) 88 MCG tablet Take 88 mcg by mouth daily before breakfast.   Yes [provider]  doxycycline (VIBRAMYCIN) 100 MG capsule Take 1 capsule (100 mg total) by mouth 2 (two) times daily. Patient not taking: No sig reported 12/03/18   Sherwood Gambler, MD     Objective    Physical Exam: Vitals:   08/19/20 1915 08/19/20 1930 08/19/20 1945 08/19/20 2054  BP: (!) 108/56 103/60 (!) 107/58 125/74  Pulse: 69 71  83  Resp: (!) 22 (!) '24 18 19  ' Temp:    98.4 F (36.9 C)  TempSrc:    Oral  SpO2: 96% (!) 88%  96%  Weight:      Height:        General:  appears to be stated age; alert, mildly confused Skin: warm, dry, no rash Head:  AT/[place] Mouth:  Oral mucosa membranes appear dry, normal dentition Neck: supple; trachea midline Heart:  RRR; did not appreciate any M/R/G Lungs: CTAB, did not appreciate any wheezes, rales, or rhonchi Abdomen: + BS; soft, ND, NT Vascular: 2+ pedal pulses b/l; 2+ radial pulses b/l Extremities: no peripheral edema, no muscle wasting Neuro: strength and sensation intact in upper and lower extremities b/l    Labs on Admission: I have personally reviewed following labs and imaging studies  CBC: Recent Labs  Lab 08/19/20 1115  WBC 28.0*  NEUTROABS 24.9*  HGB 13.1  HCT 38.6  MCV 90.2  PLT 353*   Basic Metabolic Panel: Recent Labs  Lab 08/19/20 1115  NA 133*  K 3.5  CL 98  CO2 23  GLUCOSE 218*  BUN 28*  CREATININE 1.51*  CALCIUM 8.6*   GFR: Estimated Creatinine Clearance: 21.8 mL/min (A) (by C-G formula based on SCr of 1.51 mg/dL (H)). Liver Function Tests: Recent Labs  Lab 08/19/20 1115  AST 66*  ALT 36  ALKPHOS 89  BILITOT 0.9  PROT 6.8  ALBUMIN 3.6   No results for input(s): LIPASE, AMYLASE in the last 168 hours. No results for input(s): AMMONIA in the last 168 hours. Coagulation Profile: No results for input(s): INR, PROTIME in the last 168 hours. Cardiac Enzymes: No results for input(s): CKTOTAL, CKMB, CKMBINDEX, TROPONINI in the last 168 hours. BNP (last 3 results) No results for input(s): PROBNP in the last 8760 hours. HbA1C: No results for input(s): HGBA1C in the last 72 hours. CBG: No results for input(s): GLUCAP in the last 168 hours. Lipid Profile: No results for input(s): CHOL, HDL, LDLCALC, TRIG, CHOLHDL, LDLDIRECT in the last 72 hours. Thyroid Function Tests: No results for input(s): TSH, T4TOTAL, FREET4, T3FREE, THYROIDAB in the last 72 hours. Anemia Panel: No results for input(s): VITAMINB12, FOLATE, FERRITIN, TIBC, IRON, RETICCTPCT in the last 72 hours. Urine  analysis:    Component Value Date/Time   COLORURINE YELLOW 08/19/2020 1203   APPEARANCEUR TURBID (A) 08/19/2020 1203   LABSPEC 1.020 08/19/2020 1203   PHURINE 6.0 08/19/2020 1203   GLUCOSEU NEGATIVE 08/19/2020 1203   HGBUR SMALL (A) 08/19/2020 1203   BILIRUBINUR NEGATIVE 08/19/2020 [place] 08/19/2020 1203   PROTEINUR 30 (A) 08/19/2020 1203   NITRITE NEGATIVE 08/19/2020 1203   LEUKOCYTESUR LARGE (A) 08/19/2020 1203    Radiological Exams on Admission: DG Chest 2 View  Result Date: 08/19/2020 CLINICAL DATA:  Rule out pneumonia EXAM: CHEST - 2 VIEW COMPARISON:  None. FINDINGS: Patchy infiltrate in the lingula consistent with pneumonia. No significant pleural effusion. Right lung is clear COPD with hyperinflation of the lungs. IMPRESSION: COPD with pneumonia in the lingula. Electronically Signed   By: Franchot Gallo M.D.   On: 08/19/2020 12:02   CT Head Wo Contrast  Result Date: 08/19/2020 CLINICAL DATA:  Altered mental status. EXAM: CT HEAD WITHOUT CONTRAST TECHNIQUE: Contiguous axial images were obtained from the base of the skull through the vertex without intravenous contrast. COMPARISON:  April 25, 2020. FINDINGS: Brain: Mild chronic ischemic white matter disease is noted. No mass effect or midline shift is noted. Ventricular size is within normal limits. There is no evidence of mass lesion, hemorrhage or acute infarction. Vascular: No hyperdense vessel or unexpected calcification. Skull: Bilateral craniotomies are again noted. No acute osseous abnormality is noted. Sinuses/Orbits: No acute finding. Other: None. IMPRESSION: Mild chronic ischemic white matter disease. No acute intracranial abnormality seen. Electronically Signed   By: Marijo Conception M.D.   On: 08/19/2020 11:51   CT Chest W Contrast  Result Date: 08/19/2020 CLINICAL DATA:  Abdominal pain, fever, confusion and weakness. EXAM: CT CHEST, ABDOMEN, AND PELVIS WITH CONTRAST TECHNIQUE: Multidetector CT imaging  of the chest, abdomen and pelvis was performed following the standard protocol during bolus administration of intravenous contrast. CONTRAST:  79m OMNIPAQUE IOHEXOL 300 MG/ML  SOLN COMPARISON:  Chest radiography same day FINDINGS: CT CHEST FINDINGS Cardiovascular: Heart size is normal. No pericardial fluid. Some coronary artery calcification and aortic atherosclerotic calcification is noted. No sign of large central pulmonary emboli. Mediastinum/Nodes: No mediastinal or hilar mass or lymphadenopathy. Normal size nodes. Lungs/Pleura: The right lung shows some pleural and parenchymal scarring at the apex and linear scarring at the base but is otherwise clear. No pleural fluid on the right. On the left, there is patchy infiltrate in the lingula and to a lesser extent in the left lower lobe consistent with bronchopneumonia. Follow-up to clearing is recommended. No pleural effusion. Musculoskeletal: Negative CT ABDOMEN PELVIS FINDINGS Hepatobiliary: 2 cm cyst in the  lateral aspect of the right lobe of the liver. No other liver finding. Multiple calcified and noncalcified gallstones in the gallbladder. No CT evidence of cholecystitis or obstruction. Pancreas: Normal Spleen: Normal Adrenals/Urinary Tract: Adrenal glands are normal. Kidneys are normal. No mass, stone or hydronephrosis. No sign of pyelonephritis. The bladder appears unremarkable. Stomach/Bowel: Stomach and small intestine are normal. The appendix is normal. Diverticulosis of the colon of without visible diverticulitis. Vascular/Lymphatic: Aortic atherosclerosis. No aneurysm. IVC is normal. No retroperitoneal adenopathy. Reproductive: Previous hysterectomy.  No pelvic mass peer Other: No free fluid or air. Musculoskeletal: Ordinary lower lumbar degenerative changes. Ordinary osteoarthritis of the hips. IMPRESSION: 1. Patchy infiltrate in the lingula and to a lesser extent in the left lower lobe consistent with bronchopneumonia. Follow-up to clearing is  recommended. 2. No acute abdominal or pelvic finding. 3. Aortic atherosclerosis. Coronary artery calcification. 4. Cholelithiasis without CT evidence of cholecystitis or obstruction. 5. Diverticulosis without evidence of diverticulitis. Aortic Atherosclerosis (ICD10-I70.0). Electronically Signed   By: Nelson Chimes M.D.   On: 08/19/2020 14:35   CT ABDOMEN PELVIS W CONTRAST  Result Date: 08/19/2020 CLINICAL DATA:  Abdominal pain, fever, confusion and weakness. EXAM: CT CHEST, ABDOMEN, AND PELVIS WITH CONTRAST TECHNIQUE: Multidetector CT imaging of the chest, abdomen and pelvis was performed following the standard protocol during bolus administration of intravenous contrast. CONTRAST:  24m OMNIPAQUE IOHEXOL 300 MG/ML  SOLN COMPARISON:  Chest radiography same day FINDINGS: CT CHEST FINDINGS Cardiovascular: Heart size is normal. No pericardial fluid. Some coronary artery calcification and aortic atherosclerotic calcification is noted. No sign of large central pulmonary emboli. Mediastinum/Nodes: No mediastinal or hilar mass or lymphadenopathy. Normal size nodes. Lungs/Pleura: The right lung shows some pleural and parenchymal scarring at the apex and linear scarring at the base but is otherwise clear. No pleural fluid on the right. On the left, there is patchy infiltrate in the lingula and to a lesser extent in the left lower lobe consistent with bronchopneumonia. Follow-up to clearing is recommended. No pleural effusion. Musculoskeletal: Negative CT ABDOMEN PELVIS FINDINGS Hepatobiliary: 2 cm cyst in the lateral aspect of the right lobe of the liver. No other liver finding. Multiple calcified and noncalcified gallstones in the gallbladder. No CT evidence of cholecystitis or obstruction. Pancreas: Normal Spleen: Normal Adrenals/Urinary Tract: Adrenal glands are normal. Kidneys are normal. No mass, stone or hydronephrosis. No sign of pyelonephritis. The bladder appears unremarkable. Stomach/Bowel: Stomach and small  intestine are normal. The appendix is normal. Diverticulosis of the colon of without visible diverticulitis. Vascular/Lymphatic: Aortic atherosclerosis. No aneurysm. IVC is normal. No retroperitoneal adenopathy. Reproductive: Previous hysterectomy.  No pelvic mass peer Other: No free fluid or air. Musculoskeletal: Ordinary lower lumbar degenerative changes. Ordinary osteoarthritis of the hips. IMPRESSION: 1. Patchy infiltrate in the lingula and to a lesser extent in the left lower lobe consistent with bronchopneumonia. Follow-up to clearing is recommended. 2. No acute abdominal or pelvic finding. 3. Aortic atherosclerosis. Coronary artery calcification. 4. Cholelithiasis without CT evidence of cholecystitis or obstruction. 5. Diverticulosis without evidence of diverticulitis. Aortic Atherosclerosis (ICD10-I70.0). Electronically Signed   By: MNelson ChimesM.D.   On: 08/19/2020 14:35     EKG: Independently reviewed, with result as described above.    Assessment/Plan   Madison Silveyis a 87y.o. female with medical history significant for hyperlipidemia, acquired hypothyroidism [facility] in setting of suspected pneumonia and urinary  tract infection after presenting from home to the latter facility for further evaluation of altered mental status.   Principal Problem:   Acute metabolic encephalopathy Active Problems:   CAP (community acquired pneumonia)   Acute lower UTI   Generalized weakness   AKI (acute kidney injury) ([place])   HLD (hyperlipidemia)   Acquired hypothyroidism      #) Acute metabolic encephalopathy: 2 to 3 days of confusion relative to baseline mental status, which appears to be on the basis of physiologic stressors stemming from presenting pneumonia and UTI.  No obvious additional contributory infectious process at this time, although screening nasopharyngeal  COVID-19 PCR result is currently pending. No Obvious contributory pharmacologic factors.  No additional overt metabolic/electrolyte contributions at this time.  Of note, serum sodium of 133 corrects to nearly 135 when taking into account concomitant mild presenting hyperglycemia, and is consistent with most recent prior value of 134 when checked on 06/17/2020.  No overt acute focal neurologic deficits to suggest contribution from acute CVA, while presenting CT head showed no evidence of acute intracranial process.  Seizures also felt to be less likely at this time. Will keep patient NPO until she passes nursing bedside swallow screen, which has been ordered.     Plan: NPO. Nursing bedside swallow evaluation prior to the initiation of a diet/oral medications.. CMP in the morning. Repeat CBC in the morning. check VBG to evaluate for any contribution from hypercapnic encephalopathy. Check TSH. check B12.  Add on CPK level.  Work-up evaluation of presenting CAP  as well as urinary tract infection.       #) Community-acquired pneumonia: Diagnosis of the base of 2-3 days confusion associated generalized weakness, with presenting leukocytosis and chest x-ray showing evidence of infiltrate in the lingula and left lower lobe consistent with bronchopneumonia.  Of note, aside from leukocytosis, no additional SIRS criteria are met.  Therefore, criteria are not met at the present time for sepsis.  It appears that the patient was diagnosed with COVID-19 in January 2022, with positive test on 05/08/20.  No evidence of hypoxia at this time.  As it does not appear the patient has been hospitalized in the last 3 months, will treat present pneumonia is community-acquired in nature.  Of note, prior to transfer, the patient received the following antibiotics at [facility]: Doxycycline and Rocephin, which should provide appropriate coverage for CAP, including doxycycline for prevention of atypical component.  Of note, it  appears that the patient has a concomitant urinary tract infection, as further detailed below.  COVID-19 PCR was checked at [facility] this evening, with result currently pending.  Plan: Normal saline at 50 cc/h.  Continue Rocephin and doxycycline, as above.  Check blood cultures x2.  Repeat CBC with differential.  Monitor on telemetry, and monitor on continuous pulse oximetry.  Flutter valve.  Add on strep pneumoniae urine antigen.  We will follow for results of COVID-19 PCR performed earlier this evening.      #) Urinary tract infection: In the setting of presenting confusion and generalized weakness over the last 2 to 3 days relative to baseline ambulatory mental status, presenting urinalysis notable for significant pyuria, many bacteria, large leukocyte esterase, and no overt evidence of contamination.  She received a dose of Rocephin prior to transfer from [place] ED.  The setting of her presenting acute encephalopathy as well as leukocytosis, will treat these urinary findings as a true urinary tract infection, with continuation of  antibiotics, as further detailed below. SIRS not met for sepsis at this time, as further detailed above.  Plan: Gentle IV fluids, as above.  Continue Rocephin.  Add on blood cultures x2.  Add on urine culture.  Repeat CBC with differential in the morning.      #) Generalized weakness: generalized weakness over last 2-3 days in the absence of any evidence of acute focal neurologic deficits, including no evidence of acute focal weakness. Consequently, acute ischemic CVA is felt to be less likely at this time. Suspect contribution from physiologic stress stemming from presenting pna and UTI, as further detailed above.    Plan: Repeat management of presenting [facility] pneumonia as well as urinary tract infection, including administration of IV antibiotics, as above.  Check TSH, MMA, CPK level.  Also check ionized calcium.  Physical therapy  consult is placed for the morning.  Repeat CMP and CBC in the morning.      #) Acute Kidney Injury: Presenting serum creatinine noted to be 1.51 relative to most recent prior value of 0.82 on 06/17/2020. Suspect that this is prerenal in nature with associated multifactorial contributions stemming from dehydration and multiple presenting infectious processes, as further detailed above, as well as prerenal element from relative decline in renal perfusion in the setting of initial borderline hypotensive finding.  Borderline initial hypotension has resolved with IV fluids appears to be consistent with clinical suspicion for element of mild dehydration.  Presenting urinalysis, with results as above.  Plan: Gentle IV fluids overnight, as above.  Add on random urine sodium as well as random urine creatinine.  Monitor strict I's and O's and daily weights.  Attempt avoid nephrotoxic agents.  Refrain from NSAIDs.  Repeat BMP in the morning.  Recurrent management presenting pneumonia and UTI, as above.  Add on CPK, as above.      #) Hyperlipidemia: On atorvastatin 10 mg p.o. daily.  In setting of presenting acute encephalopathy and current n.p.o. status, will hold home statin for now.  Plan: Hold home statin for now.      #) Acquired hypothyroidism: On Synthroid as an outpatient.   Plan: In the setting of presenting acute encephalopathy, will check TSH.  Holding Ozempic for now in setting of n.p.o. status.    DVT prophylaxis: scd's  Code Status: Full code Family Communication: none Disposition Plan: Per Rounding Team Consults called: none  Admission status: inpatient; med-tele.     Of note, this patient was added by me to the following Admit List/Treatment Team: [facility].      PLEASE NOTE THAT DRAGON DICTATION SOFTWARE WAS USED IN THE CONSTRUCTION OF THIS NOTE.   [place] [facility] Pager [phone] From [place]  Otherwise, please contact night-coverage   [email or web address] Password [facility]   08/19/2020, 9:14 PM

## 2020-08-19 NOTE — Progress Notes (Signed)
Brief note regarding plan, with full H&P to follow:  87 y.o. female with medical history significant for hyperlipidemia, acquired hypothyroidism who is admitted to [facility] on 08/19/2020 by way of transfer from [facility] ED with acute metabolic encephalopathy in setting of suspected pneumonia and urinary tract infection after presenting from home to the latter facility for further evaluation of altered mental status.  On Rocephin and doxycycline.  N.p.o. pending nursing bedside swallow screen.     Newton Pigg, DO Hospitalist

## 2020-08-20 DIAGNOSIS — A419 Sepsis, unspecified organism: Secondary | ICD-10-CM | POA: Diagnosis not present

## 2020-08-20 DIAGNOSIS — E039 Hypothyroidism, unspecified: Secondary | ICD-10-CM | POA: Diagnosis not present

## 2020-08-20 DIAGNOSIS — R8271 Bacteriuria: Secondary | ICD-10-CM

## 2020-08-20 DIAGNOSIS — G9341 Metabolic encephalopathy: Secondary | ICD-10-CM | POA: Diagnosis not present

## 2020-08-20 DIAGNOSIS — D696 Thrombocytopenia, unspecified: Secondary | ICD-10-CM | POA: Diagnosis not present

## 2020-08-20 DIAGNOSIS — E876 Hypokalemia: Secondary | ICD-10-CM

## 2020-08-20 DIAGNOSIS — R6520 Severe sepsis without septic shock: Secondary | ICD-10-CM

## 2020-08-20 DIAGNOSIS — R8281 Pyuria: Secondary | ICD-10-CM

## 2020-08-20 DIAGNOSIS — D649 Anemia, unspecified: Secondary | ICD-10-CM

## 2020-08-20 DIAGNOSIS — R7989 Other specified abnormal findings of blood chemistry: Secondary | ICD-10-CM

## 2020-08-20 LAB — COMPREHENSIVE METABOLIC PANEL
ALT: 28 U/L (ref 0–44)
AST: 49 U/L — ABNORMAL HIGH (ref 15–41)
Albumin: 3.1 g/dL — ABNORMAL LOW (ref 3.5–5.0)
Alkaline Phosphatase: 73 U/L (ref 38–126)
Anion gap: 8 (ref 5–15)
BUN: 28 mg/dL — ABNORMAL HIGH (ref 8–23)
CO2: 21 mmol/L — ABNORMAL LOW (ref 22–32)
Calcium: 7.9 mg/dL — ABNORMAL LOW (ref 8.9–10.3)
Chloride: 112 mmol/L — ABNORMAL HIGH (ref 98–111)
Creatinine, Ser: 1.04 mg/dL — ABNORMAL HIGH (ref 0.44–1.00)
GFR, Estimated: 51 mL/min — ABNORMAL LOW (ref >60)
Glucose, Bld: 94 mg/dL (ref 70–99)
Potassium: 3.5 mmol/L (ref 3.5–5.1)
Sodium: 141 mmol/L (ref 135–145)
Total Bilirubin: 0.9 mg/dL (ref 0.3–1.2)
Total Protein: 5.6 g/dL — ABNORMAL LOW (ref 6.5–8.1)

## 2020-08-20 LAB — BLOOD GAS, VENOUS
Acid-base deficit: 1.3 mmol/L (ref 0.0–2.0)
Bicarbonate: 21.4 mmol/L (ref 20.0–28.0)
O2 Saturation: 97.7 %
Patient temperature: 98.6
pCO2, Ven: 30.6 mmHg — ABNORMAL LOW (ref 44.0–60.0)
pH, Ven: 7.459 — ABNORMAL HIGH (ref 7.250–7.430)
pO2, Ven: 87.1 mmHg — ABNORMAL HIGH (ref 32.0–45.0)

## 2020-08-20 LAB — CBC WITH DIFFERENTIAL/PLATELET
Abs Immature Granulocytes: 0.05 10*3/uL (ref 0.00–0.07)
Basophils Absolute: 0 10*3/uL (ref 0.0–0.1)
Basophils Relative: 0 %
Eosinophils Absolute: 0 10*3/uL (ref 0.0–0.5)
Eosinophils Relative: 0 %
HCT: 32.7 % — ABNORMAL LOW (ref 36.0–46.0)
Hemoglobin: 10.9 g/dL — ABNORMAL LOW (ref 12.0–15.0)
Immature Granulocytes: 0 %
Lymphocytes Relative: 12 %
Lymphs Abs: 1.6 10*3/uL (ref 0.7–4.0)
MCH: 30.3 pg (ref 26.0–34.0)
MCHC: 33.3 g/dL (ref 30.0–36.0)
MCV: 90.8 fL (ref 80.0–100.0)
Monocytes Absolute: 1 10*3/uL (ref 0.1–1.0)
Monocytes Relative: 8 %
Neutro Abs: 10.8 10*3/uL — ABNORMAL HIGH (ref 1.7–7.7)
Neutrophils Relative %: 80 %
Platelets: 89 10*3/uL — ABNORMAL LOW (ref 150–400)
RBC: 3.6 MIL/uL — ABNORMAL LOW (ref 3.87–5.11)
RDW: 14.8 % (ref 11.5–15.5)
WBC: 13.4 10*3/uL — ABNORMAL HIGH (ref 4.0–10.5)
nRBC: 0 % (ref 0.0–0.2)

## 2020-08-20 LAB — TSH: TSH: 4.181 u[IU]/mL (ref 0.350–4.500)

## 2020-08-20 LAB — PHOSPHORUS: Phosphorus: 1.9 mg/dL — ABNORMAL LOW (ref 2.5–4.6)

## 2020-08-20 LAB — CREATININE, URINE, RANDOM: Creatinine, Urine: 104.07 mg/dL

## 2020-08-20 LAB — STREP PNEUMONIAE URINARY ANTIGEN: Strep Pneumo Urinary Antigen: NEGATIVE

## 2020-08-20 LAB — MAGNESIUM: Magnesium: 1.8 mg/dL (ref 1.7–2.4)

## 2020-08-20 LAB — SODIUM, URINE, RANDOM: Sodium, Ur: 44 mmol/L

## 2020-08-20 LAB — SARS CORONAVIRUS 2 (TAT 6-24 HRS): SARS Coronavirus 2: NEGATIVE

## 2020-08-20 MED ORDER — POTASSIUM PHOSPHATES 15 MMOLE/5ML IV SOLN
30.0000 mmol | Freq: Once | INTRAVENOUS | Status: AC
  Administered 2020-08-20: 30 mmol via INTRAVENOUS
  Filled 2020-08-20: qty 10

## 2020-08-20 NOTE — Plan of Care (Signed)

## 2020-08-20 NOTE — Plan of Care (Signed)
  Problem: Education: Goal: Knowledge of General Education information will improve Description: Including pain rating scale, medication(s)/side effects and non-pharmacologic comfort measures Outcome: Progressing   Problem: Nutrition: Goal: Adequate nutrition will be maintained Outcome: Progressing   Problem: Coping: Goal: Level of anxiety will decrease Outcome: Progressing   Problem: Urinary Elimination: Goal: Signs and symptoms of infection will decrease Outcome: Progressing

## 2020-08-20 NOTE — Evaluation (Signed)
Physical Therapy Evaluation Patient Details Name: Madison Chen MRN: [id number] DOB: 22-Nov-1927 Today's Date: 08/20/2020   History of Present Illness  87 yo female admitted with AMS, Pna, Sepsis. Hx of COVID 1/22, memory deficits.  Clinical Impression  On eval, pt required Min-Mod A for mobility. She walked ~15 feet x 2 with a RW for safety. Bladder and bowel incontinence with Min A for hygiene. Some issues with planning and processing tasks on today. No family present during session. Pt lives alone per chart and pt report. At this time, recommendation is for ST rehab at SNF if pt is agreeable to placement. If she declines placement and chooses to return home, will likely need HHPT and possibly HHA. Will plan to follow and progress activity as tolerated.     Follow Up Recommendations SNF (HHPT and possibly HHA- if pt declines placement)    Equipment Recommendations  None recommended by PT    Recommendations for Other Services       Precautions / Restrictions Precautions Precautions: Fall Precaution Comments: incontinent Restrictions Weight Bearing Restrictions: No      Mobility  Bed Mobility Overal bed mobility: Needs Assistance Bed Mobility: Supine to Sit     Supine to sit: Mod assist;HOB elevated     General bed mobility comments: Assist for LEs and trunk. Multimodal cueing required. Increased time. Some difficulty with planning and processing task.    Transfers Overall transfer level: Needs assistance Equipment used: Rolling walker (2 wheeled) Transfers: Sit to/from Stand Sit to Stand: Min assist         General transfer comment: A to rise, steady, control descent. Cues for safety, hand placement.  Ambulation/Gait Ambulation/Gait assistance: Min assist Gait Distance (Feet): 15 Feet (x2) Assistive device: Rolling walker (2 wheeled) Gait Pattern/deviations: Step-through pattern;Decreased stride length     General Gait Details: Intermittent A to steady and manage  RW. Pt also took a few steps in bathroom without RW support.  Stairs            Wheelchair Mobility    Modified Rankin (Stroke Patients Only)       Balance Overall balance assessment: Needs assistance         Standing balance support: Bilateral upper extremity supported Standing balance-Leahy Scale: Fair                               Pertinent Vitals/Pain Pain Assessment: No/denies pain    Home Living Family/patient expects to be discharged to:: Unsure Living Arrangements: Alone Available Help at Discharge: Friend(s);Family;Available PRN/intermittently Type of Home: Apartment Home Access: Stairs to enter Entrance Stairs-Rails: Right;Left Entrance Stairs-Number of Steps: 3 Home Layout: One level Home Equipment: Walker - 2 wheels;Cane - single point      Prior Function Level of Independence: Independent with assistive device(s)         Comments: uses RW vs cane in community; no device inside home. pt reports she is mod ind with ADLs     Hand Dominance        Extremity/Trunk Assessment   Upper Extremity Assessment Upper Extremity Assessment: Defer to OT evaluation    Lower Extremity Assessment Lower Extremity Assessment: Generalized weakness    Cervical / Trunk Assessment Cervical / Trunk Assessment: Kyphotic  Communication   Communication: HOH  Cognition Arousal/Alertness: Awake/alert Behavior During Therapy: WFL for tasks assessed/performed Overall Cognitive Status: No family/caregiver present to determine baseline cognitive functioning  General Comments: slow to process at times. cues required.      General Comments      Exercises     Assessment/Plan    PT Assessment Patient needs continued PT services  PT Problem List Decreased strength;Decreased mobility;Decreased balance;Decreased activity tolerance;Decreased cognition;Decreased safety awareness;Decreased knowledge of use  of DME       PT Treatment Interventions DME instruction;Gait training;Therapeutic exercise;Balance training;Functional mobility training;Therapeutic activities;Patient/family education    PT Goals (Current goals can be found in the Care Plan section)  Acute Rehab PT Goals Patient Stated Goal: return home PT Goal Formulation: With patient Time For Goal Achievement: 09/02/20 Potential to Achieve Goals: Good    Frequency Min 3X/week   Barriers to discharge        Co-evaluation               AM-PAC PT "6 Clicks" Mobility  Outcome Measure Help needed turning from your back to your side while in a flat bed without using bedrails?: A Little Help needed moving from lying on your back to sitting on the side of a flat bed without using bedrails?: A Lot Help needed moving to and from a bed to a chair (including a wheelchair)?: A Little Help needed standing up from a chair using your arms (e.g., wheelchair or bedside chair)?: A Little Help needed to walk in hospital room?: A Little Help needed climbing 3-5 steps with a railing? : A Lot 6 Click Score: 16    End of Session Equipment Utilized During Treatment: Gait belt Activity Tolerance: Patient tolerated treatment well Patient left: in chair;with call bell/phone within reach;with chair alarm set   PT Visit Diagnosis: Muscle weakness (generalized) (M62.81);Unsteadiness on feet (R26.81)    Time: 2585-2778 PT Time Calculation (min) (ACUTE ONLY): 29 min   Charges:   PT Evaluation $PT Eval Moderate Complexity: 1 Mod PT Treatments $Gait Training: 8-22 mins           Faye Ramsay, PT Acute Rehabilitation  Office: [phone] Pager: [phone]

## 2020-08-20 NOTE — Evaluation (Signed)
Occupational Therapy Evaluation Patient Details Name: Madison Chen MRN: [id number] DOB: 10/07/1927 Today's Date: 08/20/2020    History of Present Illness 87 yo female admitted with AMS, Pna, Sepsis. Hx of COVID 1/22, memory deficits.   Clinical Impression   Madison Chen is a 87 year old woman who presents with generalized weakness, decreased activity tolerance, impaired balance and decreased cognition resulting in a sudden decline in independent and safe ADLs and mobility. Patient reports living alone and independent at baseline. On evaluation patient min guard for ambulation with RW - needing verbal cues to use correctly. Patient min assist for LB dressing, setup and seated position for all other ADLs and min guard for any standing during ADLs. Patient reports memory deficits since getting COVID and her processing slow today and orientation impaired with poor insight into deficits. Patient reports managing medications at home, driving and shopping at baseline. Patient will benefit from skilled OT services while in hospital to improve deficits and learn compensatory strategies as needed in order to return PLOF.  Recommend short term rehab at discharge due to patient's physical and current cognitive deficits and without significant support at home.     Follow Up Recommendations  SNF    Equipment Recommendations  None recommended by OT    Recommendations for Other Services       Precautions / Restrictions Precautions Precautions: Fall Precaution Comments: incontinent Restrictions Weight Bearing Restrictions: No      Mobility Bed Mobility Overal bed mobility: Needs Assistance Bed Mobility: Supine to Sit     Supine to sit: Mod assist;HOB elevated     General bed mobility comments: Up in chair    Transfers Overall transfer level: Needs assistance Equipment used: Rolling walker (2 wheeled) Transfers: Sit to/from Stand Sit to Stand: Min guard         General transfer  comment: Min guard with RW to ambulate to bathroom and back. Verbal cues on walker management.    Balance Overall balance assessment: Needs assistance Sitting-balance support: No upper extremity supported Sitting balance-Leahy Scale: Good Sitting balance - Comments: able to bend down and don sock   Standing balance support: During functional activity Standing balance-Leahy Scale: Fair                             ADL either performed or assessed with clinical judgement   ADL Overall ADL's : Needs assistance/impaired Eating/Feeding: Independent   Grooming: Standing;Oral care;Wash/dry hands;Min guard Grooming Details (indicate cue type and reason): brushed teeth and washed hands at sink in standing. reliant on counter to maintain balance Upper Body Bathing: Set up;Sitting   Lower Body Bathing: Min guard;Sit to/from stand   Upper Body Dressing : Set up;Sitting   Lower Body Dressing: Sit to/from stand;Minimal assistance   Toilet Transfer: Min guard;Regular Toilet;Grab bars;RW Statistician Details (indicate cue type and reason): verbal cues on proper use of walker Toileting- Clothing Manipulation and Hygiene: Min guard;Sit to/from stand   Tub/ Shower Transfer: Community education officer Details (indicate cue type and reason): performed toileting x 2         Vision Baseline Vision/History: Macular Degeneration Patient Visual Report: No change from baseline       Perception     Praxis      Pertinent Vitals/Pain Pain Assessment: No/denies pain     Hand Dominance     Extremity/Trunk Assessment Upper Extremity Assessment Upper Extremity Assessment: Overall WFL for tasks assessed  Lower Extremity Assessment Lower Extremity Assessment: Defer to PT evaluation   Cervical / Trunk Assessment Cervical / Trunk Assessment: Kyphotic   Communication Communication Communication: HOH   Cognition Arousal/Alertness: Awake/alert Behavior During  Therapy: WFL for tasks assessed/performed Overall Cognitive Status: No family/caregiver present to determine baseline cognitive functioning                                 General Comments: Slow processing. Reports memory deficits since having COVID. Is alert to self. KNows she is in the hospital but not which one. Eventually gets the year out and knows Madison Chen just passed and required that cue to state April as the month.   General Comments       Exercises     Shoulder Instructions      Home Living Family/patient expects to be discharged to:: Unsure Living Arrangements: Alone Available Help at Discharge: Friend(s);Family;Available PRN/intermittently Type of Home: Apartment Home Access: Stairs to enter Entrance Stairs-Number of Steps: 3 Entrance Stairs-Rails: Right;Left Home Layout: One level               Home Equipment: Walker - 2 wheels;Cane - single point          Prior Functioning/Environment Level of Independence: Independent with assistive device(s)        Comments: uses RW vs cane in community; no device inside home. pt reports she is mod ind with ADLs. Performs sponge baths. Reports driving to [employer].        OT Problem List: Decreased activity tolerance;Impaired balance (sitting and/or standing);Impaired vision/perception;Decreased cognition;Decreased safety awareness;Decreased knowledge of use of DME or AE      OT Treatment/Interventions: Self-care/ADL training;Therapeutic exercise;DME and/or AE instruction;Therapeutic activities;Cognitive remediation/compensation;Balance training;Patient/family education    OT Goals(Current goals can be found in the care plan section) Acute Rehab OT Goals Patient Stated Goal: get stronger OT Goal Formulation: With patient Time For Goal Achievement: 09/03/20 Potential to Achieve Goals: Good  OT Frequency:     Barriers to D/C:            Co-evaluation              AM-PAC OT "6 Clicks" Daily  Activity     Outcome Measure Help from another person eating meals?: None Help from another person taking care of personal grooming?: A Little Help from another person toileting, which includes using toliet, bedpan, or urinal?: A Little Help from another person bathing (including washing, rinsing, drying)?: A Little Help from another person to put on and taking off regular upper body clothing?: A Little Help from another person to put on and taking off regular lower body clothing?: A Little 6 Click Score: 19   End of Session Equipment Utilized During Treatment: Rolling walker Nurse Communication: Mobility status  Activity Tolerance: Patient tolerated treatment well Patient left: in chair;with call bell/phone within reach  OT Visit Diagnosis: Unsteadiness on feet (R26.81);Other symptoms and signs involving cognitive function;Muscle weakness (generalized) (M62.81)                Time: 0865-7846 OT Time Calculation (min): 33 min Charges:  OT General Charges $OT Visit: 1 Visit OT Evaluation $OT Eval Moderate Complexity: 1 Mod OT Treatments $Self Care/Home Management : 8-22 mins  Ishia Tenorio, OTR/L Acute Care Rehab Services  Office [phone] Pager: [phone]   Kelli Churn 08/20/2020, 4:24 PM

## 2020-08-20 NOTE — Progress Notes (Signed)
PROGRESS NOTE  Madison Chen CHE:[id number] DOB: 21-Jan-1928   PCP: Patria Mane, MD  Patient is from: Home. Uses cane at baseline. Lives alone.  Independent for ADL's.  Was able to drive last month.  DOA: 08/19/2020 LOS: 1  Chief complaints: AMS, dry cough, generalized weakness  Brief Narrative / Interim history: 87 year old F with PMH of hypothyroidism, COVID-19 infection on 05/08/2020 and hyperlipidemia presenting with altered mental status, dry cough and generalized weakness for 2 to 3 days, and admitted for pneumonia AKI and possible UTI.  She had leukocytosis to 28 with left shift.  Also hypotensive to 88/50.  CTA chest with lingular and LLL pneumonia but no PE.  Cultures obtained.  Started on ceftriaxone and doxycycline, and admitted.  Subjective: Seen and examined earlier this morning.  No major events overnight or this morning.  She spiked fever to 100.7 earlier this morning.  Otherwise vital signs been stable.  Saturating at 90% on room air.  She has no complaints this morning.  Denies chest pain, shortness of breath, GI or UTI symptoms.  She is awake and alert.  She is oriented to self, hospital and the state.  She thinks she is in [employer] which is understandable since she lives in [place] and came to [employer].   Objective: Vitals:   08/19/20 2054 08/20/20 0047 08/20/20 0530 08/20/20 0800  BP: 125/74 119/66 120/60 130/66  Pulse: 83 96 92 79  Resp: 19 16 17 20   Temp: 98.4 F (36.9 C) 99.9 F (37.7 C) (!) 100.7 F (38.2 C) 98.3 F (36.8 C)  TempSrc: Oral Oral Oral Oral  SpO2: 96% 93% 90% 91%  Weight:   56.8 kg   Height:        Intake/Output Summary (Last 24 hours) at 08/20/2020 0945 Last data filed at 08/20/2020 0600 Gross per 24 hour  Intake 1286.67 ml  Output --  Net 1286.67 ml   Filed Weights   08/19/20 1051 08/20/20 0530  Weight: 56.8 kg 56.8 kg    Examination:  GENERAL: No apparent distress.  Nontoxic. HEENT: MMM.  Vision and hearing grossly  intact.  NECK: Supple.  No apparent JVD.  RESP: On RA.  No IWOB.  Some crackles over left lung. CVS:  RRR. Heart sounds normal.  ABD/GI/GU: BS+. Abd soft, NTND.  MSK/EXT:  Moves extremities. No apparent deformity. No edema.  SKIN: no apparent skin lesion or wound NEURO: Awake and alert.  Oriented to self, hospital and place as above.  No apparent focal neuro deficit. PSYCH: Calm. Normal affect.  Procedures:  None  Microbiology summarized: COVID-19 PCR nonreactive. Urine culture pending. Blood culture pending.  Assessment & Plan: Severe sepsis due to community-acquired pneumonia-did not meet sepsis criteria on presentation.  Now with fever, leukocytosis, encephalopathy and AKI.  CTA chest negative for PE but lingular and LLL pneumonia.  Significant leukocytosis but improving.  AKI and encephalopathy improving as well. -Continue IV ceftriaxone and doxycycline -Follow cultures -Incentive spirometry, OOB/PT/OT -Mucolytic's  Acute metabolic encephalopathy: Likely due to the above.  Improved.  She is oriented to self, place, person and situation.  Of note, patient's son states some decline in short-term memory lately since COVID-19 and UTI infection. -Treat treatable causes as above. -Reorientation and delirium precautions -Avoid or minimize sedating medications.  AKI/azotemia: Baseline creatinine is 0.72 on 06/17/2020.  AKI likely due to pneumonia Recent Labs    08/19/20 1115 08/20/20 0328  BUN 28* 28*  CREATININE 1.51* 1.04*  -Continue IV fluid -  Recheck in the morning  Pyuria/bacteriuria-patient and family member denies UTI symptoms.  -Already on ceftriaxone for pneumonia -Follow urine culture  Hypokalemia/hypophosphatemia -IV potassium phosphates 30 mmol x 1.  Mild rhabdomyolysis: Likely from dehydration.  CK 1000. -Continue IV fluid as above  Normocytic anemia: Seems to stable apart from dilutional change. Recent Labs    08/19/20 1115 08/20/20 0328  HGB 13.1 10.9*   -Check anemia panel in the morning -Continue monitoring   Generalized weakness: Likely due to the above. -PT/OT eval  Hypothyroidism -Continue home Synthroid  Hyperlipidemia: She is on Lipitor but not sure about the indication -Hold home Lipitor in the setting of mild rhabdo.  History of COVID-19 infection-COVID-19 PCR negative here.  Acute on chronic thrombocytopenia: Slightly lower than baseline.  Likely due to gram-negative infection Recent Labs  Lab 08/19/20 1115 08/20/20 0328  PLT 103* 89*  -SCD for VT prophylaxis. -Continue monitoring    Body mass index is 20.21 kg/m.         DVT prophylaxis:  Place and maintain sequential compression device Start: 08/20/20 0704 SCDs Start: 08/19/20 2123  Code Status: Full code.  Confirmed with patient and patient's son Family Communication: Updated patient's son over the phone. Level of care: Telemetry Status is: Inpatient  Remains inpatient appropriate because:Persistent severe electrolyte disturbances, Altered mental status, IV treatments appropriate due to intensity of illness or inability to take PO and Inpatient level of care appropriate due to severity of illness   Dispo: The patient is from: Home              Anticipated d/c is to: Home in the next 24 to 48 hours              Patient currently is not medically stable to d/c.   Difficult to place patient No       Consultants:  None   Sch Meds:  Scheduled Meds: Continuous Infusions: . sodium chloride Stopped (08/20/20 0905)  . cefTRIAXone (ROCEPHIN)  IV    . doxycycline (VIBRAMYCIN) IV 100 mg (08/19/20 2230)  . potassium PHOSPHATE IVPB (in mmol) 30 mmol (08/20/20 0905)   PRN Meds:.acetaminophen **OR** acetaminophen  Antimicrobials: Anti-infectives (From admission, onward)   Start     Dose/Rate Route Frequency Ordered Stop   08/20/20 1200  cefTRIAXone (ROCEPHIN) 2 g in sodium chloride 0.9 % 100 mL IVPB        2 g 200 mL/hr over 30 Minutes  Intravenous Every 24 hours 08/19/20 2148     08/19/20 2300  doxycycline (VIBRAMYCIN) 100 mg in sodium chloride 0.9 % 250 mL IVPB        100 mg 125 mL/hr over 120 Minutes Intravenous Every 12 hours 08/19/20 2148     08/19/20 1245  cefTRIAXone (ROCEPHIN) 1 g in sodium chloride 0.9 % 100 mL IVPB        1 g 200 mL/hr over 30 Minutes Intravenous  Once 08/19/20 1233 08/19/20 1323   08/19/20 1245  doxycycline (VIBRA-TABS) tablet 100 mg        100 mg Oral  Once 08/19/20 1233 08/19/20 1243       I have personally reviewed the following labs and images: CBC: Recent Labs  Lab 08/19/20 1115 08/20/20 0328  WBC 28.0* 13.4*  NEUTROABS 24.9* 10.8*  HGB 13.1 10.9*  HCT 38.6 32.7*  MCV 90.2 90.8  PLT 103* 89*   BMP &GFR Recent Labs  Lab 08/19/20 1115 08/19/20 2235 08/20/20 0328  NA 133*  --  141  K 3.5  --  3.5  CL 98  --  112*  CO2 23  --  21*  GLUCOSE 218*  --  94  BUN 28*  --  28*  CREATININE 1.51*  --  1.04*  CALCIUM 8.6*  --  7.9*  MG  --  2.0 1.8  PHOS  --   --  1.9*   Estimated Creatinine Clearance: 31.6 mL/min (A) (by C-G formula based on SCr of 1.04 mg/dL (H)). Liver & Pancreas: Recent Labs  Lab 08/19/20 1115 08/20/20 0328  AST 66* 49*  ALT 36 28  ALKPHOS 89 73  BILITOT 0.9 0.9  PROT 6.8 5.6*  ALBUMIN 3.6 3.1*   No results for input(s): LIPASE, AMYLASE in the last 168 hours. No results for input(s): AMMONIA in the last 168 hours. Diabetic: No results for input(s): HGBA1C in the last 72 hours. No results for input(s): GLUCAP in the last 168 hours. Cardiac Enzymes: Recent Labs  Lab 08/19/20 2235  CKTOTAL 1,024*   No results for input(s): PROBNP in the last 8760 hours. Coagulation Profile: No results for input(s): INR, PROTIME in the last 168 hours. Thyroid Function Tests: Recent Labs    08/20/20 0328  TSH 4.181   Lipid Profile: No results for input(s): CHOL, HDL, LDLCALC, TRIG, CHOLHDL, LDLDIRECT in the last 72 hours. Anemia Panel: No results for  input(s): VITAMINB12, FOLATE, FERRITIN, TIBC, IRON, RETICCTPCT in the last 72 hours. Urine analysis:    Component Value Date/Time   COLORURINE YELLOW 08/19/2020 1203   APPEARANCEUR TURBID (A) 08/19/2020 1203   LABSPEC 1.020 08/19/2020 1203   PHURINE 6.0 08/19/2020 1203   GLUCOSEU NEGATIVE 08/19/2020 1203   HGBUR SMALL (A) 08/19/2020 1203   BILIRUBINUR NEGATIVE 08/19/2020 1203   KETONESUR NEGATIVE 08/19/2020 1203   PROTEINUR 30 (A) 08/19/2020 1203   NITRITE NEGATIVE 08/19/2020 1203   LEUKOCYTESUR LARGE (A) 08/19/2020 1203   Sepsis Labs: Invalid input(s): PROCALCITONIN, LACTICIDVEN  Microbiology: Recent Results (from the past 240 hour(s))  SARS CORONAVIRUS 2 (TAT 6-24 HRS) Nasopharyngeal Nasopharyngeal Swab     Status: None   Collection Time: 08/19/20  4:01 PM   Specimen: Nasopharyngeal Swab  Result Value Ref Range Status   SARS Coronavirus 2 NEGATIVE NEGATIVE Final    Comment: (NOTE) SARS-CoV-2 target nucleic acids are NOT DETECTED.  The SARS-CoV-2 RNA is generally detectable in upper and lower respiratory specimens during the acute phase of infection. Negative results do not preclude SARS-CoV-2 infection, do not rule out co-infections with other pathogens, and should not be used as the sole basis for treatment or other patient management decisions. Negative results must be combined with clinical observations, patient history, and epidemiological information. The expected result is Negative.  Fact Sheet for Patients: HairSlick.nohttps://www.fda.gov/media/138098/download  Fact Sheet for Healthcare Providers: quierodirigir.comhttps://www.fda.gov/media/138095/download  This test is not yet approved or cleared by the [place] FDA and  has been authorized for detection and/or diagnosis of SARS-CoV-2 by FDA under an Emergency Use Authorization (EUA). This EUA will remain  in effect (meaning this test can be used) for the duration of the COVID-19 declaration under Se ction 564(b)(1) of the Act, 21  U.S.C. section 360bbb-3(b)(1), unless the authorization is terminated or revoked sooner.  Performed at [facility], [address] [place], [place] [id number]     Radiology Studies: DG Chest 2 View  Result Date: 08/19/2020 CLINICAL DATA:  Rule out pneumonia EXAM: CHEST - 2 VIEW COMPARISON:  None. FINDINGS: Patchy infiltrate in the lingula consistent  with pneumonia. No significant pleural effusion. Right lung is clear COPD with hyperinflation of the lungs. IMPRESSION: COPD with pneumonia in the lingula. Electronically Signed   By: Marlan Palau M.D.   On: 08/19/2020 12:02   CT Head Wo Contrast  Result Date: 08/19/2020 CLINICAL DATA:  Altered mental status. EXAM: CT HEAD WITHOUT CONTRAST TECHNIQUE: Contiguous axial images were obtained from the base of the skull through the vertex without intravenous contrast. COMPARISON:  April 25, 2020. FINDINGS: Brain: Mild chronic ischemic white matter disease is noted. No mass effect or midline shift is noted. Ventricular size is within normal limits. There is no evidence of mass lesion, hemorrhage or acute infarction. Vascular: No hyperdense vessel or unexpected calcification. Skull: Bilateral craniotomies are again noted. No acute osseous abnormality is noted. Sinuses/Orbits: No acute finding. Other: None. IMPRESSION: Mild chronic ischemic white matter disease. No acute intracranial abnormality seen. Electronically Signed   By: Lupita Raider M.D.   On: 08/19/2020 11:51   CT Chest W Contrast  Result Date: 08/19/2020 CLINICAL DATA:  Abdominal pain, fever, confusion and weakness. EXAM: CT CHEST, ABDOMEN, AND PELVIS WITH CONTRAST TECHNIQUE: Multidetector CT imaging of the chest, abdomen and pelvis was performed following the standard protocol during bolus administration of intravenous contrast. CONTRAST:  47mL OMNIPAQUE IOHEXOL 300 MG/ML  SOLN COMPARISON:  Chest radiography same day FINDINGS: CT CHEST FINDINGS Cardiovascular: Heart size is normal.  No pericardial fluid. Some coronary artery calcification and aortic atherosclerotic calcification is noted. No sign of large central pulmonary emboli. Mediastinum/Nodes: No mediastinal or hilar mass or lymphadenopathy. Normal size nodes. Lungs/Pleura: The right lung shows some pleural and parenchymal scarring at the apex and linear scarring at the base but is otherwise clear. No pleural fluid on the right. On the left, there is patchy infiltrate in the lingula and to a lesser extent in the left lower lobe consistent with bronchopneumonia. Follow-up to clearing is recommended. No pleural effusion. Musculoskeletal: Negative CT ABDOMEN PELVIS FINDINGS Hepatobiliary: 2 cm cyst in the lateral aspect of the right lobe of the liver. No other liver finding. Multiple calcified and noncalcified gallstones in the gallbladder. No CT evidence of cholecystitis or obstruction. Pancreas: Normal Spleen: Normal Adrenals/Urinary Tract: Adrenal glands are normal. Kidneys are normal. No mass, stone or hydronephrosis. No sign of pyelonephritis. The bladder appears unremarkable. Stomach/Bowel: Stomach and small intestine are normal. The appendix is normal. Diverticulosis of the colon of without visible diverticulitis. Vascular/Lymphatic: Aortic atherosclerosis. No aneurysm. IVC is normal. No retroperitoneal adenopathy. Reproductive: Previous hysterectomy.  No pelvic mass peer Other: No free fluid or air. Musculoskeletal: Ordinary lower lumbar degenerative changes. Ordinary osteoarthritis of the hips. IMPRESSION: 1. Patchy infiltrate in the lingula and to a lesser extent in the left lower lobe consistent with bronchopneumonia. Follow-up to clearing is recommended. 2. No acute abdominal or pelvic finding. 3. Aortic atherosclerosis. Coronary artery calcification. 4. Cholelithiasis without CT evidence of cholecystitis or obstruction. 5. Diverticulosis without evidence of diverticulitis. Aortic Atherosclerosis (ICD10-I70.0). Electronically  Signed   By: Paulina Fusi M.D.   On: 08/19/2020 14:35   CT ABDOMEN PELVIS W CONTRAST  Result Date: 08/19/2020 CLINICAL DATA:  Abdominal pain, fever, confusion and weakness. EXAM: CT CHEST, ABDOMEN, AND PELVIS WITH CONTRAST TECHNIQUE: Multidetector CT imaging of the chest, abdomen and pelvis was performed following the standard protocol during bolus administration of intravenous contrast. CONTRAST:  7mL OMNIPAQUE IOHEXOL 300 MG/ML  SOLN COMPARISON:  Chest radiography same day FINDINGS: CT CHEST FINDINGS Cardiovascular: Heart size is normal. No  pericardial fluid. Some coronary artery calcification and aortic atherosclerotic calcification is noted. No sign of large central pulmonary emboli. Mediastinum/Nodes: No mediastinal or hilar mass or lymphadenopathy. Normal size nodes. Lungs/Pleura: The right lung shows some pleural and parenchymal scarring at the apex and linear scarring at the base but is otherwise clear. No pleural fluid on the right. On the left, there is patchy infiltrate in the lingula and to a lesser extent in the left lower lobe consistent with bronchopneumonia. Follow-up to clearing is recommended. No pleural effusion. Musculoskeletal: Negative CT ABDOMEN PELVIS FINDINGS Hepatobiliary: 2 cm cyst in the lateral aspect of the right lobe of the liver. No other liver finding. Multiple calcified and noncalcified gallstones in the gallbladder. No CT evidence of cholecystitis or obstruction. Pancreas: Normal Spleen: Normal Adrenals/Urinary Tract: Adrenal glands are normal. Kidneys are normal. No mass, stone or hydronephrosis. No sign of pyelonephritis. The bladder appears unremarkable. Stomach/Bowel: Stomach and small intestine are normal. The appendix is normal. Diverticulosis of the colon of without visible diverticulitis. Vascular/Lymphatic: Aortic atherosclerosis. No aneurysm. IVC is normal. No retroperitoneal adenopathy. Reproductive: Previous hysterectomy.  No pelvic mass peer Other: No free fluid  or air. Musculoskeletal: Ordinary lower lumbar degenerative changes. Ordinary osteoarthritis of the hips. IMPRESSION: 1. Patchy infiltrate in the lingula and to a lesser extent in the left lower lobe consistent with bronchopneumonia. Follow-up to clearing is recommended. 2. No acute abdominal or pelvic finding. 3. Aortic atherosclerosis. Coronary artery calcification. 4. Cholelithiasis without CT evidence of cholecystitis or obstruction. 5. Diverticulosis without evidence of diverticulitis. Aortic Atherosclerosis (ICD10-I70.0). Electronically Signed   By: Paulina Fusi M.D.   On: 08/19/2020 14:35      Mcadoo Muzquiz T. Safire Gordin [facility]  If 7PM-7AM, please contact night-coverage [email or web address] 08/20/2020, 9:45 AM

## 2020-08-21 DIAGNOSIS — G9341 Metabolic encephalopathy: Secondary | ICD-10-CM | POA: Diagnosis not present

## 2020-08-21 LAB — IRON AND TIBC
Iron: 16 ug/dL — ABNORMAL LOW (ref 28–170)
Saturation Ratios: 11 % (ref 10.4–31.8)
TIBC: 152 ug/dL — ABNORMAL LOW (ref 250–450)
UIBC: 136 ug/dL

## 2020-08-21 LAB — RENAL FUNCTION PANEL
Albumin: 3 g/dL — ABNORMAL LOW (ref 3.5–5.0)
Anion gap: 8 (ref 5–15)
BUN: 19 mg/dL (ref 8–23)
CO2: 21 mmol/L — ABNORMAL LOW (ref 22–32)
Calcium: 7.7 mg/dL — ABNORMAL LOW (ref 8.9–10.3)
Chloride: 109 mmol/L (ref 98–111)
Creatinine, Ser: 0.86 mg/dL (ref 0.44–1.00)
GFR, Estimated: >60 mL/min (ref >60)
Glucose, Bld: 101 mg/dL — ABNORMAL HIGH (ref 70–99)
Phosphorus: 2.7 mg/dL (ref 2.5–4.6)
Potassium: 3.6 mmol/L (ref 3.5–5.1)
Sodium: 138 mmol/L (ref 135–145)

## 2020-08-21 LAB — CBC
HCT: 33.7 % — ABNORMAL LOW (ref 36.0–46.0)
Hemoglobin: 11.2 g/dL — ABNORMAL LOW (ref 12.0–15.0)
MCH: 29.7 pg (ref 26.0–34.0)
MCHC: 33.2 g/dL (ref 30.0–36.0)
MCV: 89.4 fL (ref 80.0–100.0)
Platelets: 88 10*3/uL — ABNORMAL LOW (ref 150–400)
RBC: 3.77 MIL/uL — ABNORMAL LOW (ref 3.87–5.11)
RDW: 14.7 % (ref 11.5–15.5)
WBC: 8.6 10*3/uL (ref 4.0–10.5)
nRBC: 0 % (ref 0.0–0.2)

## 2020-08-21 LAB — CK: Total CK: 371 U/L — ABNORMAL HIGH (ref 38–234)

## 2020-08-21 LAB — VITAMIN B12: Vitamin B-12: 536 pg/mL (ref 180–914)

## 2020-08-21 LAB — URINE CULTURE

## 2020-08-21 LAB — RETICULOCYTES
Immature Retic Fract: 12.1 % (ref 2.3–15.9)
RBC.: 3.73 MIL/uL — ABNORMAL LOW (ref 3.87–5.11)
Retic Count, Absolute: 47 10*3/uL (ref 19.0–186.0)
Retic Ct Pct: 1.3 % (ref 0.4–3.1)

## 2020-08-21 LAB — MAGNESIUM: Magnesium: 1.7 mg/dL (ref 1.7–2.4)

## 2020-08-21 LAB — CALCIUM, IONIZED: Calcium, Ionized, Serum: 4.6 mg/dL (ref 4.5–5.6)

## 2020-08-21 LAB — FOLATE: Folate: 12.5 ng/mL (ref >5.9)

## 2020-08-21 LAB — FERRITIN: Ferritin: 256 ng/mL (ref 11–307)

## 2020-08-21 MED ORDER — LEVOTHYROXINE SODIUM 88 MCG PO TABS
88.0000 ug | ORAL_TABLET | Freq: Every day | ORAL | Status: DC
  Administered 2020-08-22 – 2020-08-24 (×3): 88 ug via ORAL
  Filled 2020-08-21 (×3): qty 1

## 2020-08-21 NOTE — Progress Notes (Signed)
Madison Chen  DVV:[id number] DOB: 08-Aug-1927 DOA: 08/19/2020 PCP: Patria Mane, MD    Brief Narrative:  87 year old with a history of hypothyroidism, COVID-19 05/08/2020, and HLD who presented to the ER with AMS and severe generalized weakness for 2 to 3 days.  The patient was diagnosed with pneumonia, acute kidney injury, and possible UTI.  She was found to be hypotensive at 88/50.  CTA of the chest revealed no PE but evidence of a lingular and left lower lobe pneumonia.  She has been treated with ceftriaxone and doxycycline.  Consultants:  None  Code Status: FULL CODE  Antimicrobials:  Ceftriaxone 4/18 > Doxycycline 4/18 >  DVT prophylaxis: SCDs  Subjective: Sitting up in a bedside chair.  Reports that she is feeling stronger each day.  Tells me she is quite happy living independently and does not wish to enter a skilled nursing facility even for a short-term rehab stay.  Understands the risk associated with living independently and is willing to accept those risks.  Denies chest pain nausea vomiting abdominal pain.  Reports improved appetite.  Assessment & Plan:  Left lower lobe and lingular pneumonia -sepsis Continue antibiotic therapy - clinically stabilizing  Acute metabolic encephalopathy Due to acute infections  -family reported recent decline in short-term memory - B12 and folic acid not low  -pt remains competent to make her own decisions at the present time   Acute kidney injury Baseline creatinine 0.7 to - improving with volume expansion and now essentially normalized  Recent Labs  Lab 08/19/20 1115 08/20/20 0328 08/21/20 0319  CREATININE 1.51* 1.04* 0.86    Pyuria/bacteriuria No meaningful results on urine culture -should be adequately covered with antibiotic being used for pneumonia  Hypokalemia Corrected with supplementation  Hypomagnesemia Supplement to goal of 2.0  Mild myositis/rhabdomyolysis CK trending downward  Normocytic anemia Iron and TIBC  very low at 16/152 respectively - ferritin 256 but this is in the setting of an acute infection  Severe generalized weakness A SNF rehab stay has been recommended but the patient is not interested and will ultimately be discharged home at her own request  Hypothyroidism Continue home Synthroid dose  HLD Continue home Lipitor dose  Acute on chronic thrombocytopenia Likely simply due to gram-negative infection -platelet count stable   Family Communication: No family present at time of Status is: Inpatient  Remains inpatient appropriate because:Inpatient level of care appropriate due to severity of illness   Dispo: The patient is from: Home              Anticipated d/c is to: Home              Patient currently is not medically stable to d/c.   Difficult to place patient No    Objective: Blood pressure (!) 149/76, pulse 81, temperature 99.6 F (37.6 C), temperature source Oral, resp. rate 18, height 5\' 6"  (1.676 m), weight 56.8 kg, SpO2 92 %.  Intake/Output Summary (Last 24 hours) at 08/21/2020 0844 Last data filed at 08/21/2020 0500 Gross per 24 hour  Intake 2310.78 ml  Output --  Net 2310.78 ml   Filed Weights   08/19/20 1051 08/20/20 0530  Weight: 56.8 kg 56.8 kg    Examination: General: No acute respiratory distress Lungs: Mild basal crackles with no wheezing and good air movement throughout other fields Cardiovascular: Regular rate and rhythm without murmur gallop or rub normal S1 and S2 Abdomen: Nontender, nondistended, soft, bowel sounds positive, no rebound, no ascites, no appreciable mass Extremities:  No significant cyanosis, clubbing, or edema bilateral lower extremities  CBC: Recent Labs  Lab 08/19/20 1115 08/20/20 0328 08/21/20 0319  WBC 28.0* 13.4* 8.6  NEUTROABS 24.9* 10.8*  --   HGB 13.1 10.9* 11.2*  HCT 38.6 32.7* 33.7*  MCV 90.2 90.8 89.4  PLT 103* 89* 88*   Basic Metabolic Panel: Recent Labs  Lab 08/19/20 1115 08/19/20 2235  08/20/20 0328 08/21/20 0319  NA 133*  --  141 138  K 3.5  --  3.5 3.6  CL 98  --  112* 109  CO2 23  --  21* 21*  GLUCOSE 218*  --  94 101*  BUN 28*  --  28* 19  CREATININE 1.51*  --  1.04* 0.86  CALCIUM 8.6*  --  7.9* 7.7*  MG  --  2.0 1.8 1.7  PHOS  --   --  1.9* 2.7   GFR: Estimated Creatinine Clearance: 38.2 mL/min (by C-G formula based on SCr of 0.86 mg/dL).  Liver Function Tests: Recent Labs  Lab 08/19/20 1115 08/20/20 0328 08/21/20 0319  AST 66* 49*  --   ALT 36 28  --   ALKPHOS 89 73  --   BILITOT 0.9 0.9  --   PROT 6.8 5.6*  --   ALBUMIN 3.6 3.1* 3.0*    Cardiac Enzymes: Recent Labs  Lab 08/19/20 2235 08/21/20 0319  CKTOTAL 1,024* 371*     Recent Results (from the past 240 hour(s))  SARS CORONAVIRUS 2 (TAT 6-24 HRS) Nasopharyngeal Nasopharyngeal Swab     Status: None   Collection Time: 08/19/20  4:01 PM   Specimen: Nasopharyngeal Swab  Result Value Ref Range Status   SARS Coronavirus 2 NEGATIVE NEGATIVE Final    Comment: (NOTE) SARS-CoV-2 target nucleic acids are NOT DETECTED.  The SARS-CoV-2 RNA is generally detectable in upper and lower respiratory specimens during the acute phase of infection. Negative results do not preclude SARS-CoV-2 infection, do not rule out co-infections with other pathogens, and should not be used as the sole basis for treatment or other patient management decisions. Negative results must be combined with clinical observations, patient history, and epidemiological information. The expected result is Negative.  Fact Sheet for Patients: HairSlick.no  Fact Sheet for Healthcare Providers: quierodirigir.com  This test is not yet approved or cleared by the [place] FDA and  has been authorized for detection and/or diagnosis of SARS-CoV-2 by FDA under an Emergency Use Authorization (EUA). This EUA will remain  in effect (meaning this test can be used) for the  duration of the COVID-19 declaration under Se ction 564(b)(1) of the Act, 21 U.S.C. section 360bbb-3(b)(1), unless the authorization is terminated or revoked sooner.  Performed at [facility], [address] [place], [place] [address]   Culture, blood (Routine X 2) w Reflex to ID Panel     Status: None (Preliminary result)   Collection Time: 08/19/20 10:35 PM   Specimen: BLOOD LEFT ARM  Result Value Ref Range Status   Specimen Description   Final    BLOOD LEFT ARM Performed at [facility], [address] [place], [place] [address]    Special Requests   Final    BOTTLES DRAWN AEROBIC AND ANAEROBIC Blood Culture adequate volume Performed at [facility], [address], [place], [place] [address]    Culture   Final    NO GROWTH 1 DAY Performed at [facility], [address] [place], [place] [address]  Report Status PENDING  Incomplete  Culture, blood (Routine X 2) w Reflex to ID Panel     Status: None (Preliminary result)   Collection Time: 08/19/20 10:35 PM   Specimen: BLOOD LEFT HAND  Result Value Ref Range Status   Specimen Description   Final    BLOOD LEFT HAND Performed at [facility], [address], [place], [place] [address]    Special Requests   Final    BOTTLES DRAWN AEROBIC ONLY Blood Culture adequate volume Performed at [facility], [address] [place], [place] [address]    Culture   Final    NO GROWTH 1 DAY Performed at [facility], [address] [place], [place] [address]    Report Status PENDING  Incomplete     Scheduled Meds: Continuous Infusions: . sodium chloride Stopped (08/20/20 0905)  . cefTRIAXone (ROCEPHIN)  IV 2 g (08/20/20 1136)  . doxycycline (VIBRAMYCIN) IV 100 mg (08/20/20 2245)     LOS: 2 days   Lonia Blood, MD [facility] Office  [phone] Pager - Text Page per Amion  If 7PM-7AM, please contact  night-coverage per Amion 08/21/2020, 8:44 AM

## 2020-08-21 NOTE — Progress Notes (Signed)
Pt is continent of bowel an bladder, would benefit from scheduled bathroom visits to reduce urgency incontinence when walking to the bathroom.  Pt is a 1+ assist with the walker.

## 2020-08-21 NOTE — Plan of Care (Signed)

## 2020-08-22 DIAGNOSIS — G9341 Metabolic encephalopathy: Secondary | ICD-10-CM | POA: Diagnosis not present

## 2020-08-22 LAB — CBC
HCT: 35.8 % — ABNORMAL LOW (ref 36.0–46.0)
Hemoglobin: 11.5 g/dL — ABNORMAL LOW (ref 12.0–15.0)
MCH: 29.5 pg (ref 26.0–34.0)
MCHC: 32.1 g/dL (ref 30.0–36.0)
MCV: 91.8 fL (ref 80.0–100.0)
Platelets: 108 10*3/uL — ABNORMAL LOW (ref 150–400)
RBC: 3.9 MIL/uL (ref 3.87–5.11)
RDW: 14.5 % (ref 11.5–15.5)
WBC: 9.4 10*3/uL (ref 4.0–10.5)
nRBC: 0 % (ref 0.0–0.2)

## 2020-08-22 LAB — BASIC METABOLIC PANEL
Anion gap: 8 (ref 5–15)
BUN: 20 mg/dL (ref 8–23)
CO2: 23 mmol/L (ref 22–32)
Calcium: 8.3 mg/dL — ABNORMAL LOW (ref 8.9–10.3)
Chloride: 107 mmol/L (ref 98–111)
Creatinine, Ser: 1.1 mg/dL — ABNORMAL HIGH (ref 0.44–1.00)
GFR, Estimated: 47 mL/min — ABNORMAL LOW (ref >60)
Glucose, Bld: 100 mg/dL — ABNORMAL HIGH (ref 70–99)
Potassium: 3.6 mmol/L (ref 3.5–5.1)
Sodium: 138 mmol/L (ref 135–145)

## 2020-08-22 LAB — MAGNESIUM: Magnesium: 1.7 mg/dL (ref 1.7–2.4)

## 2020-08-22 MED ORDER — CEFUROXIME AXETIL 250 MG PO TABS
250.0000 mg | ORAL_TABLET | Freq: Two times a day (BID) | ORAL | Status: DC
  Administered 2020-08-22 – 2020-08-24 (×5): 250 mg via ORAL
  Filled 2020-08-22 (×8): qty 1

## 2020-08-22 MED ORDER — DOXYCYCLINE HYCLATE 100 MG PO TABS
100.0000 mg | ORAL_TABLET | Freq: Two times a day (BID) | ORAL | Status: AC
  Administered 2020-08-22 – 2020-08-24 (×4): 100 mg via ORAL
  Filled 2020-08-22 (×4): qty 1

## 2020-08-22 MED ORDER — MAGNESIUM SULFATE 2 GM/50ML IV SOLN
2.0000 g | Freq: Once | INTRAVENOUS | Status: AC
  Administered 2020-08-22: 2 g via INTRAVENOUS
  Filled 2020-08-22: qty 50

## 2020-08-22 NOTE — Progress Notes (Signed)
Pharmacy IV to PO conversion  This patient is receiving doxycycline by the intravenous route. Based on criteria approved by the Pharmacy and Therapeutics Committee, and the Infectious Disease Division, the antibiotic(s) is/are being converted to equivalent oral dose form(s). These criteria include:   Patient being treated for a respiratory tract infection, urinary tract infection, cellulitis, or Clostridium Difficile Associated Diarrhea  The patient is not neutropenic and does not exhibit a GI malabsorption state  The patient is eating (either orally or per tube) and/or has been taking other orally administered medications for at least 24 hours.  The patient is improving clinically (physician assessment and a 24-hour Tmax of <=100.5 F)  If you have any questions about this conversion, please contact the Pharmacy Department (ext [phone]).  Thank you.  Bernadene Person, PharmD, BCPS [phone] 08/22/2020, 11:24 AM

## 2020-08-22 NOTE — Progress Notes (Signed)
Occupational Therapy Treatment Patient Details Name: Madison Chen MRN: [id number] DOB: May 04, 1928 Today's Date: 08/22/2020    History of present illness 87 yo female admitted with AMS, Pna, Sepsis. Hx of COVID 1/22, memory deficits.   OT comments  This 87 yo female seen today with focus on having her do the pill box test which she did not do well on--of note she has macular degeneration (still drives) and only takes all of her pills at breakfast; thus there a couple of other  "obstacles" to her in this task other than cognition. Given all of this she still had trouble processing information (read or verbally) and then following through with task at hand (pill box)--Max cues. In addition she is still quite unsteady without an AD (her normal for in the house). Still recommend SNF prior to D/C home.  Follow Up Recommendations  SNF;Supervision/Assistance - 24 hour;Other (comment) (if declines SNF then [facility])    Equipment Recommendations  None recommended by OT       Precautions / Restrictions Precautions Precautions: Fall Restrictions Weight Bearing Restrictions: No       Mobility Bed Mobility               General bed mobility comments: Up in chair    Transfers Overall transfer level: Needs assistance Equipment used: None Transfers: Sit to/from Stand Sit to Stand: Min guard         General transfer comment: Min A to ambulate without AD, holding onto furniture as she went. She agreed the RW would be better for now.    Balance Overall balance assessment: Needs assistance Sitting-balance support: No upper extremity supported Sitting balance-Leahy Scale: Good     Standing balance support: Single extremity supported Standing balance-Leahy Scale: Poor                             ADL either performed or assessed with clinical judgement   ADL Overall ADL's : Needs assistance/impaired     Grooming: Wash/dry hands;Standing Grooming Details (indicate cue type  and reason): min guard A for balance                 Toilet Transfer: Minimal assistance;Ambulation;BSC Toilet Transfer Details (indicate cue type and reason): over toilet, no RW (does not use AD in her home, only outside) Toileting- Architect and Hygiene: Min guard;Sit to/from stand               Vision Baseline Vision/History: Macular Degeneration Patient Visual Report: No change from baseline            Cognition Arousal/Alertness: Awake/alert Behavior During Therapy: WFL for tasks assessed/performed Overall Cognitive Status: No family/caregiver present to determine baseline cognitive functioning                                 General Comments: Had pt do Pillbox test (of note pt has macular degeneraton and takes all of her pills at breakfast). She was able to read some of the pill bottles with increased time and cues to there being more than one line to what she needed to read, I tried reading them to her and have her do the task, but this still did not work (I had to tell her over and over that this was just a "pretent" situation not the way for her take pills at home, she had a hard  time putting them in the correct holes of pillbox due to her macular degeneration).                   Pertinent Vitals/ Pain       Pain Assessment: Faces Faces Pain Scale: Hurts little more Pain Location: left groin (espically when lifting her legs into bed) Pain Descriptors / Indicators: Sore Pain Intervention(s): Limited activity within patient's tolerance;Monitored during session         Frequency  Min 2X/week        Progress Toward Goals  OT Goals(current goals can now be found in the care plan section)  Progress towards OT goals: Progressing toward goals  Acute Rehab OT Goals Patient Stated Goal: get stronger and hopefully go home,but I'm not sure OT Goal Formulation: With patient Time For Goal Achievement: 09/03/20 Potential to Achieve  Goals: Good  Plan Discharge plan remains appropriate       AM-PAC OT "6 Clicks" Daily Activity     Outcome Measure   Help from another person eating meals?: None Help from another person taking care of personal grooming?: A Little Help from another person toileting, which includes using toliet, bedpan, or urinal?: A Little Help from another person bathing (including washing, rinsing, drying)?: A Little Help from another person to put on and taking off regular upper body clothing?: A Little Help from another person to put on and taking off regular lower body clothing?: A Little 6 Click Score: 19    End of Session    OT Visit Diagnosis: Unsteadiness on feet (R26.81);Other symptoms and signs involving cognitive function;Muscle weakness (generalized) (M62.81);Pain Pain - Right/Left: Left Pain - part of body:  (groin)   Activity Tolerance Patient tolerated treatment well   Patient Left in chair;with call bell/phone within reach;with chair alarm set           Time: 1610-9604 OT Time Calculation (min): 36 min  Charges: OT General Charges $OT Visit: 1 Visit OT Treatments $Self Care/Home Management : 23-37 mins  Ignacia Palma, OTR/L Acute [employer] Pager [phone] Office [phone]      Evette Georges 08/22/2020, 12:54 PM

## 2020-08-22 NOTE — Care Management Important Message (Signed)
Important Message  Patient Details IM Letter given to son [name] Name: Madison Chen MRN: [id number] Date of Birth: 1928-04-01   Medicare Important Message Given:  Yes     Caren Macadam 08/22/2020, 12:04 PM

## 2020-08-22 NOTE — TOC Progression Note (Signed)
Transition of Care [facility]) - Progression Note    Patient Details  Name: Madison Chen MRN: [id number] Date of Birth: 22-Nov-1927  Transition of Care [facility]) CM/SW Contact  Geni Bers, RN Phone Number: 08/22/2020, 2:17 PM  Clinical Narrative:     Spoke with pt concerning discharge plans to to SNF. Pt agreed with going to SNF for short term. A call was also made to pt's son concerning SNF. Pt was faxed to SNF. Waiting bed offers and will need insurance auth.  Expected Discharge Plan: Skilled Nursing Facility Barriers to Discharge: No Barriers Identified  Expected Discharge Plan and Services Expected Discharge Plan: Skilled Nursing Facility       Living arrangements for the past 2 months: Apartment                                       Social Determinants of Health (SDOH) Interventions    Readmission Risk Interventions No flowsheet data found.

## 2020-08-22 NOTE — Progress Notes (Signed)
Physical Therapy Treatment Patient Details Name: Madison Chen MRN: [id number] DOB: 12-17-1927 Today's Date: 08/22/2020    History of Present Illness 87 yo female admitted with AMS, Pna, Sepsis. Hx of COVID 1/22, memory deficits.    PT Comments    Patient pleasantly confused. States that she is going home tomorrow. Patient ambulated x 49' with RW.   Follow Up Recommendations  SNF     Equipment Recommendations  None recommended by PT    Recommendations for Other Services       Precautions / Restrictions Precautions Precautions: Fall Precaution Comments: incontinent Restrictions Weight Bearing Restrictions: No    Mobility  Bed Mobility   Bed Mobility: Sit to Supine       Sit to supine: Min assist   General bed mobility comments: assist with legs after patient attempted    Transfers Overall transfer level: Needs assistance Equipment used: Rolling walker (2 wheeled) Transfers: Sit to/from Stand Sit to Stand: Min guard         General transfer comment: from recliner and  toilet, decreased control of descent to toilet  Ambulation/Gait Ambulation/Gait assistance: Min assist Gait Distance (Feet): 20 Feet (then 90) Assistive device: Rolling walker (2 wheeled) Gait Pattern/deviations: Step-through pattern;Decreased stride length Gait velocity: decr   General Gait Details: assist to move around  objects   Stairs             Wheelchair Mobility    Modified Rankin (Stroke Patients Only)       Balance Overall balance assessment: Needs assistance Sitting-balance support: Single extremity supported Sitting balance-Leahy Scale: Fair Sitting balance - Comments: while post void wiping and pulling up briefs   Standing balance support: Single extremity supported Standing balance-Leahy Scale: Poor                              Cognition Arousal/Alertness: Awake/alert Behavior During Therapy: WFL for tasks assessed/performed Overall Cognitive  Status: No family/caregiver present to determine baseline cognitive functioning                                 General Comments: states that she is going home tomorrow,( really to go to SNF)      Exercises      General Comments        Pertinent Vitals/Pain Pain Assessment: Faces Faces Pain Scale: Hurts little more Pain Location: back and thighs lifting her legs onto bed Pain Descriptors / Indicators: Sore Pain Intervention(s): Premedicated before session    Home Living                      Prior Function            PT Goals (current goals can now be found in the care plan section) Acute Rehab PT Goals Patient Stated Goal: get stronger and hopefully go home,but I'm not sure Progress towards PT goals: Progressing toward goals    Frequency    Min 2X/week      PT Plan Current plan remains appropriate;Frequency needs to be updated    Co-evaluation              AM-PAC PT "6 Clicks" Mobility   Outcome Measure  Help needed turning from your back to your side while in a flat bed without using bedrails?: A Little Help needed moving from lying on your back to sitting  on the side of a flat bed without using bedrails?: A Little Help needed moving to and from a bed to a chair (including a wheelchair)?: A Little Help needed standing up from a chair using your arms (e.g., wheelchair or bedside chair)?: A Little Help needed to walk in hospital room?: A Little Help needed climbing 3-5 steps with a railing? : A Lot 6 Click Score: 17    End of Session Equipment Utilized During Treatment: Gait belt Activity Tolerance: Patient tolerated treatment well Patient left: in bed;with call bell/phone within reach;with bed alarm set Nurse Communication: Mobility status PT Visit Diagnosis: Muscle weakness (generalized) (M62.81);Unsteadiness on feet (R26.81)     Time: 8016-5537 PT Time Calculation (min) (ACUTE ONLY): 18 min  Charges:  $Gait Training:  8-22 mins                     Blanchard Kelch PT Acute Rehabilitation Services Pager [phone] Office [phone]    Rada Hay 08/22/2020, 4:01 PM

## 2020-08-22 NOTE — NC FL2 (Signed)
  [place] MEDICAID FL2 LEVEL OF CARE SCREENING TOOL     IDENTIFICATION  Patient Name: Madison Chen Birthdate: August 05, 1927 Sex: female Admission Date (Current Location): 08/19/2020  [facility] and [place] Number:  Producer, television/film/video and Address:  [facility],  [address] [place], [place] [address]      Provider Number: [id number]  Attending Physician Name and Address:  Lonia Blood, MD  Relative Name and Phone Number:  Jaedan, Schuman   [phone]    Current Level of Care: Hospital Recommended Level of Care: Skilled Nursing Facility Prior Approval Number:    Date Approved/Denied:   PASRR Number: [id number]  Discharge Plan: SNF    Current Diagnoses: Patient Active Problem List   Diagnosis Date Noted  . CAP (community acquired pneumonia) 08/19/2020  . Acute metabolic encephalopathy 08/19/2020  . Acute lower UTI 08/19/2020  . Generalized weakness 08/19/2020  . AKI (acute kidney injury) (HCC) 08/19/2020  . HLD (hyperlipidemia)   . Acquired hypothyroidism     Orientation RESPIRATION BLADDER Height & Weight     Self,Place  Normal Incontinent Weight: 56.8 kg Height:  5\' 6"  (167.6 cm)  BEHAVIORAL SYMPTOMS/MOOD NEUROLOGICAL BOWEL NUTRITION STATUS      Incontinent Diet  AMBULATORY STATUS COMMUNICATION OF NEEDS Skin   Extensive Assist Verbally Other (Comment) (Dry, Flaky, ecchymosis, cracking bilateral feet heal, bilateral arms)                       Personal Care Assistance Level of Assistance  Bathing,Feeding,Dressing Bathing Assistance: Limited assistance Feeding assistance: Independent Dressing Assistance: Limited assistance     Functional Limitations Info  Sight,Hearing,Speech Sight Info: Impaired Hearing Info: Adequate Speech Info: Adequate    SPECIAL CARE FACTORS FREQUENCY  PT (By licensed PT),OT (By licensed OT)     PT Frequency: x5 week OT Frequency: x5 week            Contractures Contractures Info: Not present     Additional Factors Info  Code Status,Allergies Code Status Info: FULL Allergies Info: Alendronate, Ibandronic Acid, Sulfa Antibiotics           Current Medications (08/22/2020):  This is the current hospital active medication list Current Facility-Administered Medications  Medication Dose Route Frequency Provider Last Rate Last Admin  . acetaminophen (TYLENOL) tablet 650 mg  650 mg Oral Q6H PRN Howerter, Justin B, DO   650 mg at 08/22/20 0906  . cefUROXime (CEFTIN) tablet 250 mg  250 mg Oral BID WC 08/24/20, MD   250 mg at 08/22/20 1239  . doxycycline (VIBRA-TABS) tablet 100 mg  100 mg Oral Q12H Wofford, Drew A, RPH      . levothyroxine (SYNTHROID) tablet 88 mcg  88 mcg Oral QAC breakfast 08/24/20, MD   88 mcg at 08/22/20 0540     Discharge Medications: Please see discharge summary for a list of discharge medications.  Relevant Imaging Results:  Relevant Lab Results:   Additional Information SS#[id number]  04-01-1982, RN

## 2020-08-22 NOTE — Progress Notes (Signed)
Madison Chen  EVO:[id number] DOB: 07-30-1927 DOA: 08/19/2020 PCP: Patria Mane, MD    Brief Narrative:  [phone] with a hx of hypothyroidism, COVID-19 05/08/2020, and HLD who presented to the ER with AMS and severe generalized weakness for 2-3 days.  The patient was diagnosed with pneumonia, acute kidney injury, and possible UTI.  She was found to be hypotensive at 88/50.  CTA of the chest revealed no PE but evidence of a lingular and left lower lobe pneumonia.  She has been treated with ceftriaxone and doxycycline.  Consultants:  None  Code Status: FULL CODE  Antimicrobials:  Ceftriaxone 4/18 > Doxycycline 4/18 >  DVT prophylaxis: SCDs  Subjective: Afebrile.  Vital signs stable.  Saturation 92% on room air.  Sitting up at bedside chair with no new complaints.  States her appetite is much improved.  Denies nausea or vomiting.  Assessment & Plan:  Left lower lobe and lingular pneumonia -sepsis Continue antibiotic therapy - clinically stabilizing -transition to oral antibiotics  Acute metabolic encephalopathy Due to acute infections  -family reported recent decline in short-term memory - B12 and folic acid not low - pt remains competent to make her own decisions at the present time -mental status has returned to her baseline  Acute kidney injury Baseline creatinine 0.7 to 0.9 - creatinine appears to be stable  Recent Labs  Lab 08/19/20 1115 08/20/20 0328 08/21/20 0319 08/22/20 0341  CREATININE 1.51* 1.04* 0.86 1.10*    Pyuria/bacteriuria No meaningful results on urine culture -should be adequately covered with antibiotic being used for pneumonia  Hypokalemia Corrected with supplementation  Hypomagnesemia Supplement to goal of 2.0 - dose with magnesium today  Mild myositis/rhabdomyolysis CK trended downward with volume  Normocytic anemia Iron and TIBC very low at 16/152 respectively - ferritin 256 but this is in the setting of an acute infection -this may be nutritionally  derived and with such a low TIBC I do not feel that IV iron infusion would be terribly beneficial at this time - encouraged improved nutrition  Severe generalized weakness A SNF rehab stay has been recommended -though initially hesitant the patient has now agreed and the search for a SNF rehab bed has begun  Hypothyroidism Continue home Synthroid dose  HLD Continue home Lipitor dose  Acute on chronic thrombocytopenia Likely simply due to gram-negative infection -platelet count improving   Family Communication: Spoke with son via telephone Status is: Inpatient  Remains inpatient appropriate because:Inpatient level of care appropriate due to severity of illness   Dispo: The patient is from: Home              Anticipated d/c is to: Home              Patient currently is not medically stable to d/c.   Difficult to place patient No    Objective: Blood pressure 131/78, pulse 70, temperature 97.9 F (36.6 C), resp. rate 15, height 5\' 6"  (1.676 m), weight 56.8 kg, SpO2 92 %.  Intake/Output Summary (Last 24 hours) at 08/22/2020 0826 Last data filed at 08/22/2020 0051 Gross per 24 hour  Intake 470 ml  Output --  Net 470 ml   Filed Weights   08/19/20 1051 08/20/20 0530  Weight: 56.8 kg 56.8 kg    Examination: General: No acute respiratory distress Lungs: Mild basal crackles on the left, clear throughout other fields, no wheezing Cardiovascular: RRR without rub Abdomen: NT/ND, soft, BS positive, no rebound Extremities: No significant edema B LE  CBC: Recent Labs  Lab 08/19/20 1115 08/20/20 0328 08/21/20 0319 08/22/20 0341  WBC 28.0* 13.4* 8.6 9.4  NEUTROABS 24.9* 10.8*  --   --   HGB 13.1 10.9* 11.2* 11.5*  HCT 38.6 32.7* 33.7* 35.8*  MCV 90.2 90.8 89.4 91.8  PLT 103* 89* 88* 108*   Basic Metabolic Panel: Recent Labs  Lab 08/20/20 0328 08/21/20 0319 08/22/20 0341  NA 141 138 138  K 3.5 3.6 3.6  CL 112* 109 107  CO2 21* 21* 23  GLUCOSE 94 101* 100*  BUN  28* 19 20  CREATININE 1.04* 0.86 1.10*  CALCIUM 7.9* 7.7* 8.3*  MG 1.8 1.7 1.7  PHOS 1.9* 2.7  --    GFR: Estimated Creatinine Clearance: 29.9 mL/min (A) (by C-G formula based on SCr of 1.1 mg/dL (H)).  Liver Function Tests: Recent Labs  Lab 08/19/20 1115 08/20/20 0328 08/21/20 0319  AST 66* 49*  --   ALT 36 28  --   ALKPHOS 89 73  --   BILITOT 0.9 0.9  --   PROT 6.8 5.6*  --   ALBUMIN 3.6 3.1* 3.0*    Cardiac Enzymes: Recent Labs  Lab 08/19/20 2235 08/21/20 0319  CKTOTAL 1,024* 371*     Recent Results (from the past 240 hour(s))  SARS CORONAVIRUS 2 (TAT 6-24 HRS) Nasopharyngeal Nasopharyngeal Swab     Status: None   Collection Time: 08/19/20  4:01 PM   Specimen: Nasopharyngeal Swab  Result Value Ref Range Status   SARS Coronavirus 2 NEGATIVE NEGATIVE Final    Comment: (NOTE) SARS-CoV-2 target nucleic acids are NOT DETECTED.  The SARS-CoV-2 RNA is generally detectable in upper and lower respiratory specimens during the acute phase of infection. Negative results do not preclude SARS-CoV-2 infection, do not rule out co-infections with other pathogens, and should not be used as the sole basis for treatment or other patient management decisions. Negative results must be combined with clinical observations, patient history, and epidemiological information. The expected result is Negative.  Fact Sheet for Patients: HairSlick.no  Fact Sheet for Healthcare Providers: quierodirigir.com  This test is not yet approved or cleared by the [place] FDA and  has been authorized for detection and/or diagnosis of SARS-CoV-2 by FDA under an Emergency Use Authorization (EUA). This EUA will remain  in effect (meaning this test can be used) for the duration of the COVID-19 declaration under Se ction 564(b)(1) of the Act, 21 U.S.C. section 360bbb-3(b)(1), unless the authorization is terminated or revoked  sooner.  Performed at [facility], [address] [place], [place] [address]   Culture, blood (Routine X 2) w Reflex to ID Panel     Status: None (Preliminary result)   Collection Time: 08/19/20 10:35 PM   Specimen: BLOOD LEFT ARM  Result Value Ref Range Status   Specimen Description   Final    BLOOD LEFT ARM Performed at [facility], [address], [place], [place] [address]    Special Requests   Final    BOTTLES DRAWN AEROBIC AND ANAEROBIC Blood Culture adequate volume Performed at [facility], [address] [place], [place] [address]    Culture   Final    NO GROWTH 2 DAYS Performed at [facility], [address], [place], [place] [address]    Report Status PENDING  Incomplete  Culture, blood (Routine X 2) w Reflex to ID Panel     Status: None (Preliminary result)   Collection Time: 08/19/20 10:35 PM  Specimen: BLOOD LEFT HAND  Result Value Ref Range Status   Specimen Description   Final    BLOOD LEFT HAND Performed at [facility], [address], [place], [place] [address]    Special Requests   Final    BOTTLES DRAWN AEROBIC ONLY Blood Culture adequate volume Performed at [facility], [address] [place], [place] [address]    Culture   Final    NO GROWTH 2 DAYS Performed at [facility], [address] [place], [place] [address]    Report Status PENDING  Incomplete  Culture, Urine     Status: Abnormal   Collection Time: 08/20/20  6:13 AM   Specimen: Urine, Clean Catch  Result Value Ref Range Status   Specimen Description   Final    URINE, CLEAN CATCH Performed at [facility], [address], [place], [place] [address]    Special Requests   Final    NONE Performed at [facility], [address] [place], [place] [address]    Culture MULTIPLE SPECIES PRESENT, SUGGEST RECOLLECTION (A)  Final   Report Status 08/21/2020  FINAL  Final     Scheduled Meds: . levothyroxine  88 mcg Oral QAC breakfast   Continuous Infusions: . cefTRIAXone (ROCEPHIN)  IV 2 g (08/21/20 1353)  . doxycycline (VIBRAMYCIN) IV 100 mg (08/21/20 2229)     LOS: 3 days   Lonia Blood, MD [facility] Office  [phone] Pager - Text Page per Amion  If 7PM-7AM, please contact night-coverage per Amion 08/22/2020, 8:26 AM

## 2020-08-23 DIAGNOSIS — G9341 Metabolic encephalopathy: Secondary | ICD-10-CM | POA: Diagnosis not present

## 2020-08-23 LAB — BASIC METABOLIC PANEL
Anion gap: 8 (ref 5–15)
BUN: 15 mg/dL (ref 8–23)
CO2: 24 mmol/L (ref 22–32)
Calcium: 8.3 mg/dL — ABNORMAL LOW (ref 8.9–10.3)
Chloride: 107 mmol/L (ref 98–111)
Creatinine, Ser: 0.87 mg/dL (ref 0.44–1.00)
GFR, Estimated: >60 mL/min (ref >60)
Glucose, Bld: 99 mg/dL (ref 70–99)
Potassium: 3.4 mmol/L — ABNORMAL LOW (ref 3.5–5.1)
Sodium: 139 mmol/L (ref 135–145)

## 2020-08-23 LAB — CBC
HCT: 32.7 % — ABNORMAL LOW (ref 36.0–46.0)
Hemoglobin: 11 g/dL — ABNORMAL LOW (ref 12.0–15.0)
MCH: 30.2 pg (ref 26.0–34.0)
MCHC: 33.6 g/dL (ref 30.0–36.0)
MCV: 89.8 fL (ref 80.0–100.0)
Platelets: 106 10*3/uL — ABNORMAL LOW (ref 150–400)
RBC: 3.64 MIL/uL — ABNORMAL LOW (ref 3.87–5.11)
RDW: 14.5 % (ref 11.5–15.5)
WBC: 10.5 10*3/uL (ref 4.0–10.5)
nRBC: 0 % (ref 0.0–0.2)

## 2020-08-23 LAB — MAGNESIUM: Magnesium: 2.2 mg/dL (ref 1.7–2.4)

## 2020-08-23 MED ORDER — POTASSIUM CHLORIDE CRYS ER 20 MEQ PO TBCR
40.0000 meq | EXTENDED_RELEASE_TABLET | Freq: Once | ORAL | Status: AC
  Administered 2020-08-23: 40 meq via ORAL
  Filled 2020-08-23: qty 2

## 2020-08-23 NOTE — TOC Progression Note (Signed)
Transition of Care [facility]) - Progression Note    Patient Details  Name: Janaia Kozel MRN: [id number] Date of Birth: 08/25/1927  [facility]) CM/SW Contact  Geni Bers, RN Phone Number: 08/23/2020, 3:03 PM  Clinical Narrative:     Pt's son selected [facility] in [place]. [employer] authorization was started Ref # [id number]. Clinicals faxed to [phone].   Expected Discharge Plan: Skilled Nursing Facility Barriers to Discharge: No Barriers Identified  Expected Discharge Plan and Services Expected Discharge Plan: Skilled Nursing Facility       Living arrangements for the past 2 months: Apartment                                       Social Determinants of Health (SDOH) Interventions    Readmission Risk Interventions No flowsheet data found.

## 2020-08-23 NOTE — Progress Notes (Signed)
Madison Chen  KWI:[id number] DOB: [birth year] DOA: 08/19/2020 PCP: Patria Mane, MD    Brief Narrative:  [phone] with a hx of hypothyroidism, COVID-19 05/08/2020, and HLD who presented to the ER with AMS and severe generalized weakness for 2-3 days.  The patient was diagnosed with pneumonia, acute kidney injury, and possible UTI.  She was found to be hypotensive at 88/50.  CTA of the chest revealed no PE but evidence of a lingular and left lower lobe pneumonia.  She has been treated with ceftriaxone and doxycycline.  Consultants:  None  Code Status: FULL CODE  Antimicrobials:  Ceftriaxone 4/18 > Doxycycline 4/18 >  DVT prophylaxis: SCDs  Subjective: Afebrile.  Vital signs stable.  Awaiting SNF bed availability.  Assessment & Plan:  Left lower lobe and lingular pneumonia -sepsis Continue antibiotic therapy to complete 7 days of tx total - clinically stable - transitioned to oral antibiotics  Acute metabolic encephalopathy Due to acute infections - family reported recent decline in short-term memory - B12 and folic acid not low - pt remains competent to make her own decisions at the present time -mental status has returned to her baseline  Acute kidney injury Baseline creatinine 0.7 to 0.9 - creatinine stable  Recent Labs  Lab 08/19/20 1115 08/20/20 0328 08/21/20 0319 08/22/20 0341 08/23/20 0328  CREATININE 1.51* 1.04* 0.86 1.10* 0.87    Pyuria/bacteriuria No meaningful results on urine culture -should be adequately covered with antibiotic being used for pneumonia  Hypokalemia Mild - supplement further today   Hypomagnesemia Supplemented to goal of 2.0  Mild myositis/rhabdomyolysis CK trended downward with volume  Normocytic anemia Iron and TIBC very low at 16/152 respectively - ferritin 256 but this is in the setting of an acute infection -this may be nutritionally derived and with such a low TIBC I do not feel that IV iron infusion would be terribly beneficial at this  time - encouraged improved nutrition  Severe generalized weakness A SNF rehab stay has been recommended -though initially hesitant the patient has now agreed and the search for a SNF rehab bed has begun  Hypothyroidism Continue home Synthroid dose  HLD Continue home Lipitor dose  Acute on chronic thrombocytopenia Likely simply due to gram-negative infection -platelet count improving   Family Communication: Spoke with son via telephone Status is: Inpatient  Remains inpatient appropriate because:Inpatient level of care appropriate due to severity of illness   Dispo: The patient is from: Home              Anticipated d/c is to: Home              Patient currently is not medically stable to d/c.   Difficult to place patient No    Objective: Blood pressure 128/80, pulse 68, temperature 99.1 F (37.3 C), temperature source Oral, resp. rate 20, height 5\' 6"  (1.676 m), weight 56.8 kg, SpO2 97 %.  Intake/Output Summary (Last 24 hours) at 08/23/2020 0951 Last data filed at 08/23/2020 0900 Gross per 24 hour  Intake 780 ml  Output 300 ml  Net 480 ml   Filed Weights   08/19/20 1051 08/20/20 0530  Weight: 56.8 kg 56.8 kg    Examination: General: No acute respiratory distress Lungs: Mild basal crackles on the left, clear throughout other fields, no wheezing Cardiovascular: RRR without rub Abdomen: NT/ND, soft, BS positive, no rebound Extremities: No significant edema B LE  CBC: Recent Labs  Lab 08/19/20 1115 08/20/20 0328 08/21/20 0319 08/22/20 0341 08/23/20 0328  WBC 28.0* 13.4* 8.6 9.4 10.5  NEUTROABS 24.9* 10.8*  --   --   --   HGB 13.1 10.9* 11.2* 11.5* 11.0*  HCT 38.6 32.7* 33.7* 35.8* 32.7*  MCV 90.2 90.8 89.4 91.8 89.8  PLT 103* 89* 88* 108* 106*   Basic Metabolic Panel: Recent Labs  Lab 08/20/20 0328 08/21/20 0319 08/22/20 0341 08/23/20 0328  NA 141 138 138 139  K 3.5 3.6 3.6 3.4*  CL 112* 109 107 107  CO2 21* 21* 23 24  GLUCOSE 94 101* 100* 99   BUN 28* 19 20 15   CREATININE 1.04* 0.86 1.10* 0.87  CALCIUM 7.9* 7.7* 8.3* 8.3*  MG 1.8 1.7 1.7 2.2  PHOS 1.9* 2.7  --   --    GFR: Estimated Creatinine Clearance: 37.8 mL/min (by C-G formula based on SCr of 0.87 mg/dL).  Liver Function Tests: Recent Labs  Lab 08/19/20 1115 08/20/20 0328 08/21/20 0319  AST 66* 49*  --   ALT 36 28  --   ALKPHOS 89 73  --   BILITOT 0.9 0.9  --   PROT 6.8 5.6*  --   ALBUMIN 3.6 3.1* 3.0*    Cardiac Enzymes: Recent Labs  Lab 08/19/20 2235 08/21/20 0319  CKTOTAL 1,024* 371*     Recent Results (from the past 240 hour(s))  SARS CORONAVIRUS 2 (TAT 6-24 HRS) Nasopharyngeal Nasopharyngeal Swab     Status: None   Collection Time: 08/19/20  4:01 PM   Specimen: Nasopharyngeal Swab  Result Value Ref Range Status   SARS Coronavirus 2 NEGATIVE NEGATIVE Final    Comment: (NOTE) SARS-CoV-2 target nucleic acids are NOT DETECTED.  The SARS-CoV-2 RNA is generally detectable in upper and lower respiratory specimens during the acute phase of infection. Negative results do not preclude SARS-CoV-2 infection, do not rule out co-infections with other pathogens, and should not be used as the sole basis for treatment or other patient management decisions. Negative results must be combined with clinical observations, patient history, and epidemiological information. The expected result is Negative.  Fact Sheet for Patients: 08/21/20  Fact Sheet for Healthcare Providers: HairSlick.no  This test is not yet approved or cleared by the [place] FDA and  has been authorized for detection and/or diagnosis of SARS-CoV-2 by FDA under an Emergency Use Authorization (EUA). This EUA will remain  in effect (meaning this test can be used) for the duration of the COVID-19 declaration under Se ction 564(b)(1) of the Act, 21 U.S.C. section 360bbb-3(b)(1), unless the authorization is terminated  or revoked sooner.  Performed at [facility], [address] [place], [place]   Culture, blood (Routine X 2) w Reflex to ID Panel     Status: None (Preliminary result)   Collection Time: 08/19/20 10:35 PM   Specimen: BLOOD LEFT ARM  Result Value Ref Range Status   Specimen Description   Final    BLOOD LEFT ARM Performed at [facility], [address] [place], [place]    Special Requests   Final    BOTTLES DRAWN AEROBIC AND ANAEROBIC Blood Culture adequate volume Performed at [facility], [address], [place], [place]    Culture   Final    NO GROWTH 3 DAYS Performed at [facility], [address] [place], [place]    Report Status PENDING  Incomplete  Culture, blood (Routine X 2) w Reflex to ID Panel     Status:  None (Preliminary result)   Collection Time: 08/19/20 10:35 PM   Specimen: BLOOD LEFT HAND  Result Value Ref Range Status   Specimen Description   Final    BLOOD LEFT HAND Performed at [facility], [address], [place], [place] [address]    Special Requests   Final    BOTTLES DRAWN AEROBIC ONLY Blood Culture adequate volume Performed at [facility], [address] [place], [place] [address]    Culture   Final    NO GROWTH 3 DAYS Performed at [facility], [address], [place], [place] [address]    Report Status PENDING  Incomplete  Culture, Urine     Status: Abnormal   Collection Time: 08/20/20  6:13 AM   Specimen: Urine, Clean Catch  Result Value Ref Range Status   Specimen Description   Final    URINE, CLEAN CATCH Performed at [facility], [address] [place], [place] [address]    Special Requests   Final    NONE Performed at [facility], [address], [place], [place] [address]    Culture MULTIPLE SPECIES PRESENT, SUGGEST RECOLLECTION (A)  Final   Report  Status 08/21/2020 FINAL  Final     Scheduled Meds: . cefUROXime  250 mg Oral BID WC  . doxycycline  100 mg Oral Q12H  . levothyroxine  88 mcg Oral QAC breakfast     LOS: 4 days   Lonia Blood, MD [facility] Office  [phone] Pager - Text Page per Loretha Stapler  If 7PM-7AM, please contact night-coverage per Amion 08/23/2020, 9:51 AM

## 2020-08-24 DIAGNOSIS — G9341 Metabolic encephalopathy: Secondary | ICD-10-CM | POA: Diagnosis not present

## 2020-08-24 LAB — SARS CORONAVIRUS 2 (TAT 6-24 HRS): SARS Coronavirus 2: NEGATIVE

## 2020-08-24 MED ORDER — CEFUROXIME AXETIL 250 MG PO TABS: 250.0000 mg | ORAL_TABLET | Freq: Two times a day (BID) | ORAL | 0 refills | Status: AC

## 2020-08-24 MED ORDER — FERROUS SULFATE 325 (65 FE) MG PO TABS
325.0000 mg | ORAL_TABLET | Freq: Every day | ORAL | Status: DC
  Administered 2020-08-24: 325 mg via ORAL
  Filled 2020-08-24: qty 1

## 2020-08-24 MED ORDER — ACETAMINOPHEN 325 MG PO TABS: 650.0000 mg | ORAL_TABLET | Freq: Four times a day (QID) | ORAL | Status: AC | PRN

## 2020-08-24 NOTE — Discharge Instructions (Signed)

## 2020-08-24 NOTE — Discharge Summary (Addendum)
DISCHARGE SUMMARY  Madison Chen  MR#: [id number]  DOB:11-28-1927  Date of Admission: 08/19/2020 Date of Discharge: 08/24/2020  Attending Physician:Lyann Hagstrom Silvestre Gunner, MD  Patient's KWI:[id number], Kathlene November, MD  Consults: None  Disposition: Discharge to SNF for rehab stay   Follow-up Appts:  Contact information for follow-up providers     Patria Mane, MD Follow up in 2 week(s).   Specialty: Internal Medicine             Contact information for after-discharge care     Destination     [facility] .   Service: Skilled Nursing Contact information: [address] [place] [address] [phone]                     Discharge Diagnoses: Left lower lobe and lingular pneumonia Sepsis (NOT POA) due to PNA (severity of illness worsened during initial hospitalization) Acute metabolic encephalopathy Acute kidney injury Pyuria/bacteriuria Hypokalemia Hypomagnesemia Mild myositis/rhabdomyolysis Normocytic anemia Severe generalized weakness Hypothyroidism HLD Acute on chronic thrombocytopenia   Initial presentation: 87yo with a hx of hypothyroidism, COVID-19 05/08/2020, and HLD who presented to the ER with AMS and severe generalized weakness for 2-3 days.  The patient was diagnosed with pneumonia, acute kidney injury, and a possible UTI.  She was found to be hypotensive at 88/50.  CTA of the chest revealed no PE but evidence of a lingular and left lower lobe pneumonia.  She was treated with ceftriaxone and doxycycline.  Hospital Course:  Left lower lobe and lingular pneumonia - sepsis Continue antibiotic therapy to complete 7 days of tx total - clinically stable - transitioned to oral antibiotics prior to discharge - stopped doxy portion of tx after 5 days of therapy     Acute metabolic encephalopathy Due to acute infection - family reported recent decline in short-term memory - B12 and folic acid not low - pt remains competent to make  her own decisions at the present time -mental status has returned to her baseline at the time of her discharge   Acute kidney injury Baseline creatinine 0.7 to 0.9 - creatinine stable at the time of discharge  Recent Labs  Lab 08/19/20 1115 08/20/20 0328 08/21/20 0319 08/22/20 0341 08/23/20 0328  CREATININE 1.51* 1.04* 0.86 1.10* 0.87    Pyuria/bacteriuria No meaningful results on urine culture -should have been adequately covered with antibiotic used for pneumonia -no clinical symptoms to suggest active urinary tract infection at time of discharge   Hypokalemia Mild -supplemented intermittently during hospital stay   Hypomagnesemia Supplemented to goal of 2.0   Mild myositis/rhabdomyolysis CK trended downward with volume   Normocytic anemia Iron and TIBC very low at 16/152 respectively - ferritin 256 but this is in the setting of an acute infection -this is likely nutritionally derived and with such a low TIBC I do not feel that IV iron infusion would be terribly beneficial at this time - encouraged improved nutrition - cont oral Fe    Severe generalized weakness A SNF rehab stay has been recommended - though initially hesitant the patient has now agreed to a short term SNF rehab stay - the ultimate plan is for the patient to return home after her rehab stay   Hypothyroidism Continue home Synthroid dose   HLD Continue home Lipitor dose   Acute on chronic thrombocytopenia Likely simply due to gram-negative infection -platelet count improving at time of discharge    Allergies as of 08/24/2020  Reactions   Alendronate    Other reaction(s): Other   Ibandronic Acid    Other reaction(s): Other   Sulfa Antibiotics         Medication List     TAKE these medications    acetaminophen 325 MG tablet Commonly known as: TYLENOL Take 2 tablets (650 mg total) by mouth every 6 (six) hours as needed for mild pain (or Fever >/= 101).   atorvastatin 10 MG  tablet Commonly known as: LIPITOR Take 1 tablet by mouth daily.   cefUROXime 250 MG tablet Commonly known as: CEFTIN Take 1 tablet (250 mg total) by mouth 2 (two) times daily with a meal for 2 days.   Cholecalciferol 25 MCG (1000 UT) capsule Take 1,000 Units by mouth daily.   Ferrous Sulfate Dried 143 (45 Fe) MG Tbcr Take 143 mg by mouth daily.   glucosamine-chondroitin 500-400 MG tablet Take 1 tablet by mouth daily.   levothyroxine 88 MCG tablet Commonly known as: SYNTHROID Take 88 mcg by mouth daily before breakfast.        Day of Discharge BP 116/74 (BP Location: Left Arm)   Pulse 65   Temp 98.2 F (36.8 C) (Oral)   Resp 17   Ht 5\' 6"  (1.676 m)   Wt 56.8 kg   SpO2 97%   BMI 20.21 kg/m   Physical Exam: General: No acute respiratory distress Lungs: Clear to auscultation bilaterally without wheezes or crackles Cardiovascular: Regular rate and rhythm without murmur gallop or rub normal S1 and S2 Abdomen: Nontender, nondistended, soft, bowel sounds positive, no rebound, no ascites, no appreciable mass Extremities: No significant cyanosis, clubbing, or edema bilateral lower extremities  Basic Metabolic Panel: Recent Labs  Lab 08/19/20 1115 08/19/20 2235 08/20/20 0328 08/21/20 0319 08/22/20 0341 08/23/20 0328  NA 133*  --  141 138 138 139  K 3.5  --  3.5 3.6 3.6 3.4*  CL 98  --  112* 109 107 107  CO2 23  --  21* 21* 23 24  GLUCOSE 218*  --  94 101* 100* 99  BUN 28*  --  28* 19 20 15   CREATININE 1.51*  --  1.04* 0.86 1.10* 0.87  CALCIUM 8.6*  --  7.9* 7.7* 8.3* 8.3*  MG  --  2.0 1.8 1.7 1.7 2.2  PHOS  --   --  1.9* 2.7  --   --     Liver Function Tests: Recent Labs  Lab 08/19/20 1115 08/20/20 0328 08/21/20 0319  AST 66* 49*  --   ALT 36 28  --   ALKPHOS 89 73  --   BILITOT 0.9 0.9  --   PROT 6.8 5.6*  --   ALBUMIN 3.6 3.1* 3.0*    CBC: Recent Labs  Lab 08/19/20 1115 08/20/20 0328 08/21/20 0319 08/22/20 0341 08/23/20 0328  WBC 28.0*  13.4* 8.6 9.4 10.5  NEUTROABS 24.9* 10.8*  --   --   --   HGB 13.1 10.9* 11.2* 11.5* 11.0*  HCT 38.6 32.7* 33.7* 35.8* 32.7*  MCV 90.2 90.8 89.4 91.8 89.8  PLT 103* 89* 88* 108* 106*     Recent Results (from the past 240 hour(s))  SARS CORONAVIRUS 2 (TAT 6-24 HRS) Nasopharyngeal Nasopharyngeal Swab     Status: None   Collection Time: 08/19/20  4:01 PM   Specimen: Nasopharyngeal Swab  Result Value Ref Range Status   SARS Coronavirus 2 NEGATIVE NEGATIVE Final    Comment: (NOTE) SARS-CoV-2 target nucleic acids are  NOT DETECTED.  The SARS-CoV-2 RNA is generally detectable in upper and lower respiratory specimens during the acute phase of infection. Negative results do not preclude SARS-CoV-2 infection, do not rule out co-infections with other pathogens, and should not be used as the sole basis for treatment or other patient management decisions. Negative results must be combined with clinical observations, patient history, and epidemiological information. The expected result is Negative.  Fact Sheet for Patients: HairSlick.nohttps://www.fda.gov/media/138098/download  Fact Sheet for Healthcare Providers: quierodirigir.comhttps://www.fda.gov/media/138095/download  This test is not yet approved or cleared by the [place] FDA and  has been authorized for detection and/or diagnosis of SARS-CoV-2 by FDA under an Emergency Use Authorization (EUA). This EUA will remain  in effect (meaning this test can be used) for the duration of the COVID-19 declaration under Se ction 564(b)(1) of the Act, 21 U.S.C. section 360bbb-3(b)(1), unless the authorization is terminated or revoked sooner.  Performed at [facility], [address] [place], [place] [address]   Culture, blood (Routine X 2) w Reflex to ID Panel     Status: None (Preliminary result)   Collection Time: 08/19/20 10:35 PM   Specimen: BLOOD LEFT ARM  Result Value Ref Range Status   Specimen Description   Final    BLOOD LEFT ARM Performed at  [facility], [address] [place], [place] [address]    Special Requests   Final    BOTTLES DRAWN AEROBIC AND ANAEROBIC Blood Culture adequate volume Performed at [facility], [address] [place], [place] [address]    Culture   Final    NO GROWTH 4 DAYS Performed at [facility], [address] [place], [place] [address]    Report Status PENDING  Incomplete  Culture, blood (Routine X 2) w Reflex to ID Panel     Status: None (Preliminary result)   Collection Time: 08/19/20 10:35 PM   Specimen: BLOOD LEFT HAND  Result Value Ref Range Status   Specimen Description   Final    BLOOD LEFT HAND Performed at [facility], [address] [place], [place] [address]    Special Requests   Final    BOTTLES DRAWN AEROBIC ONLY Blood Culture adequate volume Performed at [facility], [address] [place], [place] [address]    Culture   Final    NO GROWTH 4 DAYS Performed at [facility], [address] [place], [place] [address]    Report Status PENDING  Incomplete  Culture, Urine     Status: Abnormal   Collection Time: 08/20/20  6:13 AM   Specimen: Urine, Clean Catch  Result Value Ref Range Status   Specimen Description   Final    URINE, CLEAN CATCH Performed at [facility], [address] [place], [place] [address]    Special Requests   Final    NONE Performed at [facility], [address] [place], [place] [address]    Culture MULTIPLE SPECIES PRESENT, SUGGEST RECOLLECTION (A)  Final   Report Status 08/21/2020 FINAL  Final  SARS CORONAVIRUS 2 (TAT 6-24 HRS) Nasopharyngeal Nasopharyngeal Swab     Status: None   Collection Time: 08/23/20  5:00 PM   Specimen: Nasopharyngeal Swab  Result Value Ref Range Status   SARS Coronavirus 2 NEGATIVE NEGATIVE Final    Comment: (NOTE) SARS-CoV-2 target nucleic acids are NOT DETECTED.  The SARS-CoV-2 RNA  is generally detectable in upper and  lower respiratory specimens during the acute phase of infection. Negative results do not preclude SARS-CoV-2 infection, do not rule out co-infections with other pathogens, and should not be used as the sole basis for treatment or other patient management decisions. Negative results must be combined with clinical observations, patient history, and epidemiological information. The expected result is Negative.  Fact Sheet for Patients: HairSlick.no  Fact Sheet for Healthcare Providers: quierodirigir.com  This test is not yet approved or cleared by the [place] FDA and  has been authorized for detection and/or diagnosis of SARS-CoV-2 by FDA under an Emergency Use Authorization (EUA). This EUA will remain  in effect (meaning this test can be used) for the duration of the COVID-19 declaration under Se ction 564(b)(1) of the Act, 21 U.S.C. section 360bbb-3(b)(1), unless the authorization is terminated or revoked sooner.  Performed at [facility], [address], [place], [place] [address]       Time spent in discharge (includes decision making & examination of pt): 35 minutes  08/24/2020, 12:09 PM   Lonia Blood, MD [facility] Office  [phone]

## 2020-08-24 NOTE — TOC Transition Note (Addendum)
Transition of Care [facility]) - CM/SW Discharge Note   Patient Details  Name: Madison Chen MRN: [id number] Date of Birth: 04/04/1928  Transition of Care [facility]) CM/SW Contact:  Darleene Cleaver, LCSW Phone Number: 08/24/2020, 1:12 PM   Clinical Narrative:     Patient to be d/c'ed today to [facility] room 131.  Patient and family agreeable to plans will transport via ems RN to call report to [phone].  Patient's son is aware of patient discharging today.  Patient's son Mellody Dance would like to be notified when EMS arrives, [phone].   Final next level of care: Skilled Nursing Facility Barriers to Discharge: Barriers Resolved   Patient Goals and CMS Choice Patient states their goals for this hospitalization and ongoing recovery are:: To go to SNF for short term rehab, then return back home. CMS Medicare.gov Compare Post Acute Care list provided to:: Patient Represenative (must comment) Choice offered to / list presented to : Patient,Adult Children  Discharge Placement PASRR number recieved: 08/21/20            Patient [facility] bed at: [facility] Patient to be transferred to facility by: [facility] Name of family member notified: Patient's son Patient and family notified of of transfer: 08/24/20  Discharge Plan and Services   Patient will be going to SNF for short term rehab.     Social Determinants of Health (SDOH) Interventions     Readmission Risk Interventions No flowsheet data found.

## 2020-08-25 LAB — CULTURE, BLOOD (ROUTINE X 2)
Culture: NO GROWTH
Culture: NO GROWTH
Special Requests: ADEQUATE
Special Requests: ADEQUATE

## 2020-08-28 LAB — METHYLMALONIC ACID, SERUM: Methylmalonic Acid, Quantitative: 394 nmol/L — ABNORMAL HIGH (ref 0–378)

## 2022-01-02 DEATH — deceased

## 2022-02-20 IMAGING — DX DG CHEST 2V
2 series · 2 of 2 positions shown · non-contrast
Comparison: None.

CLINICAL DATA: Rule out pneumonia

EXAM:
CHEST - 2 VIEW

[chest pa]
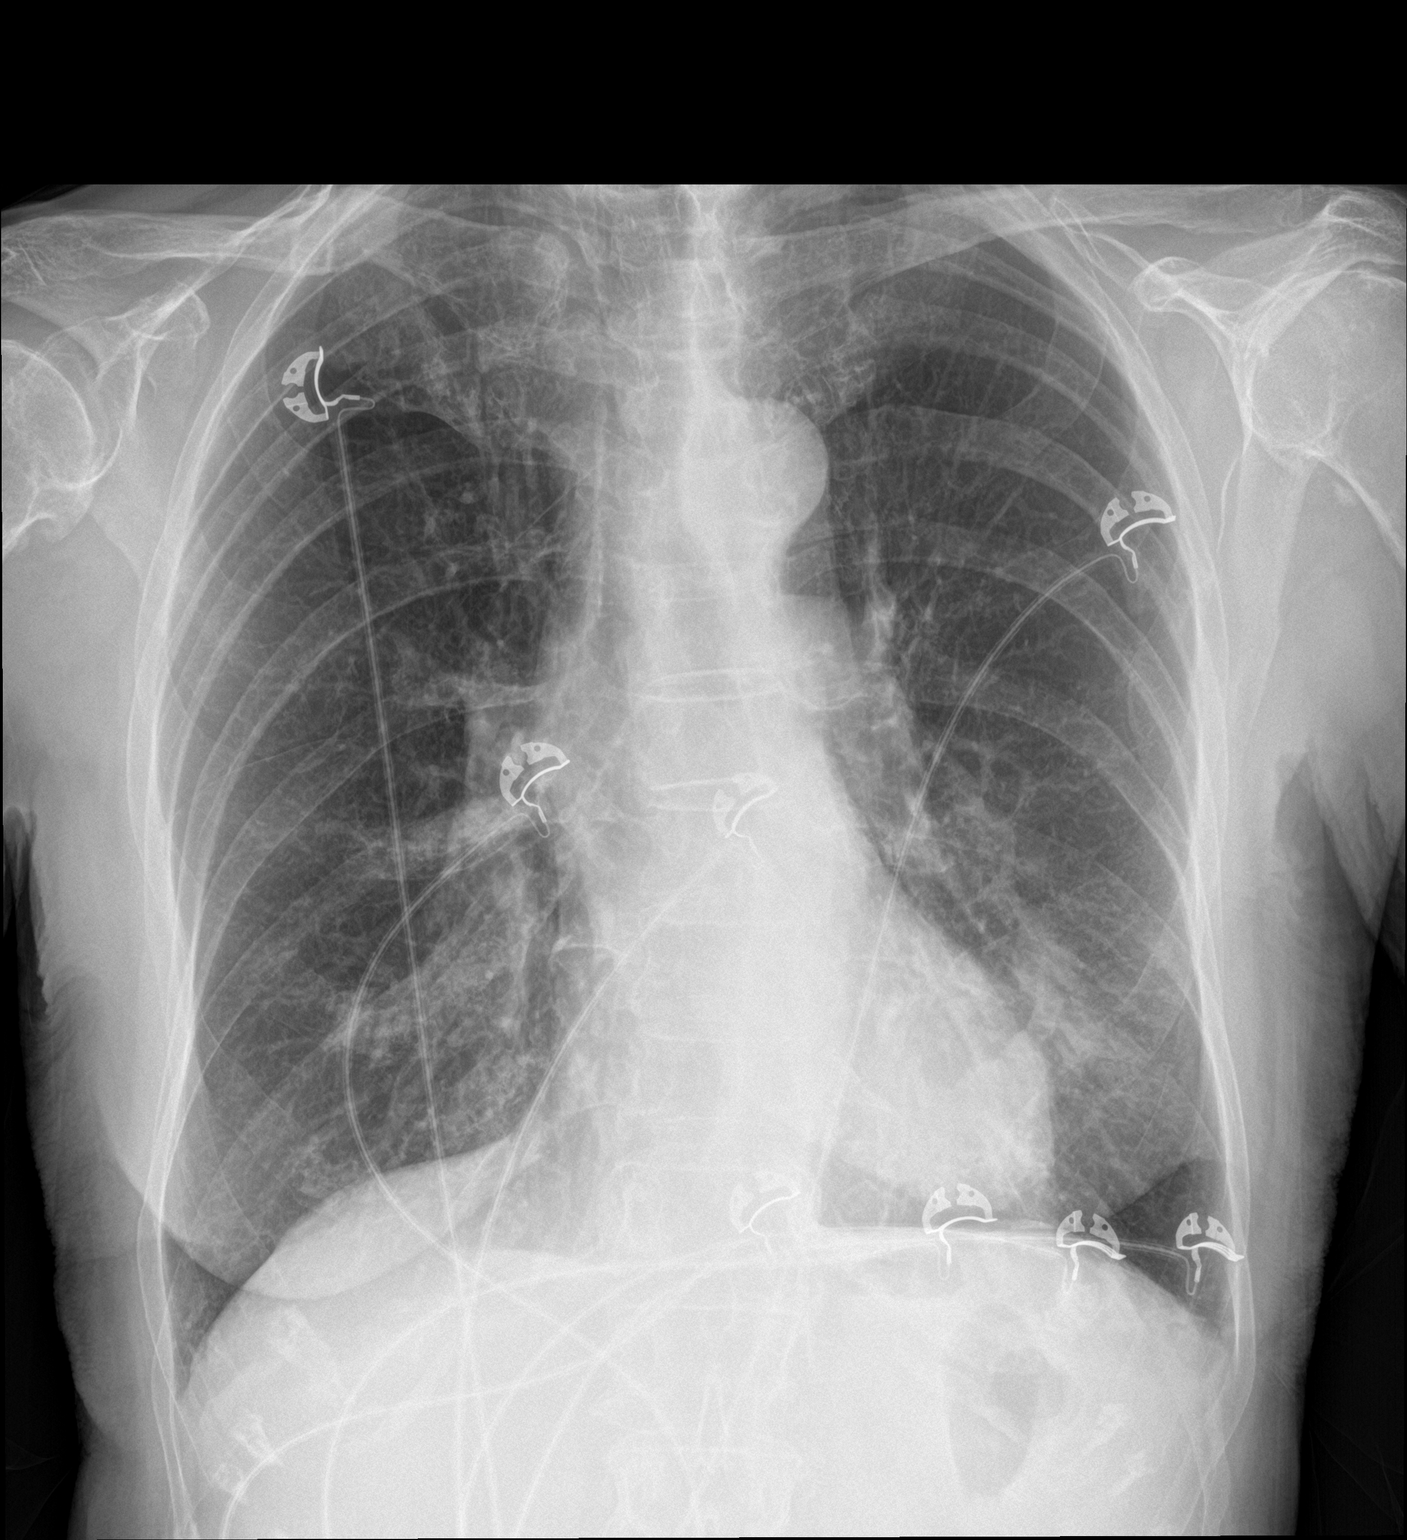

[chest lat]
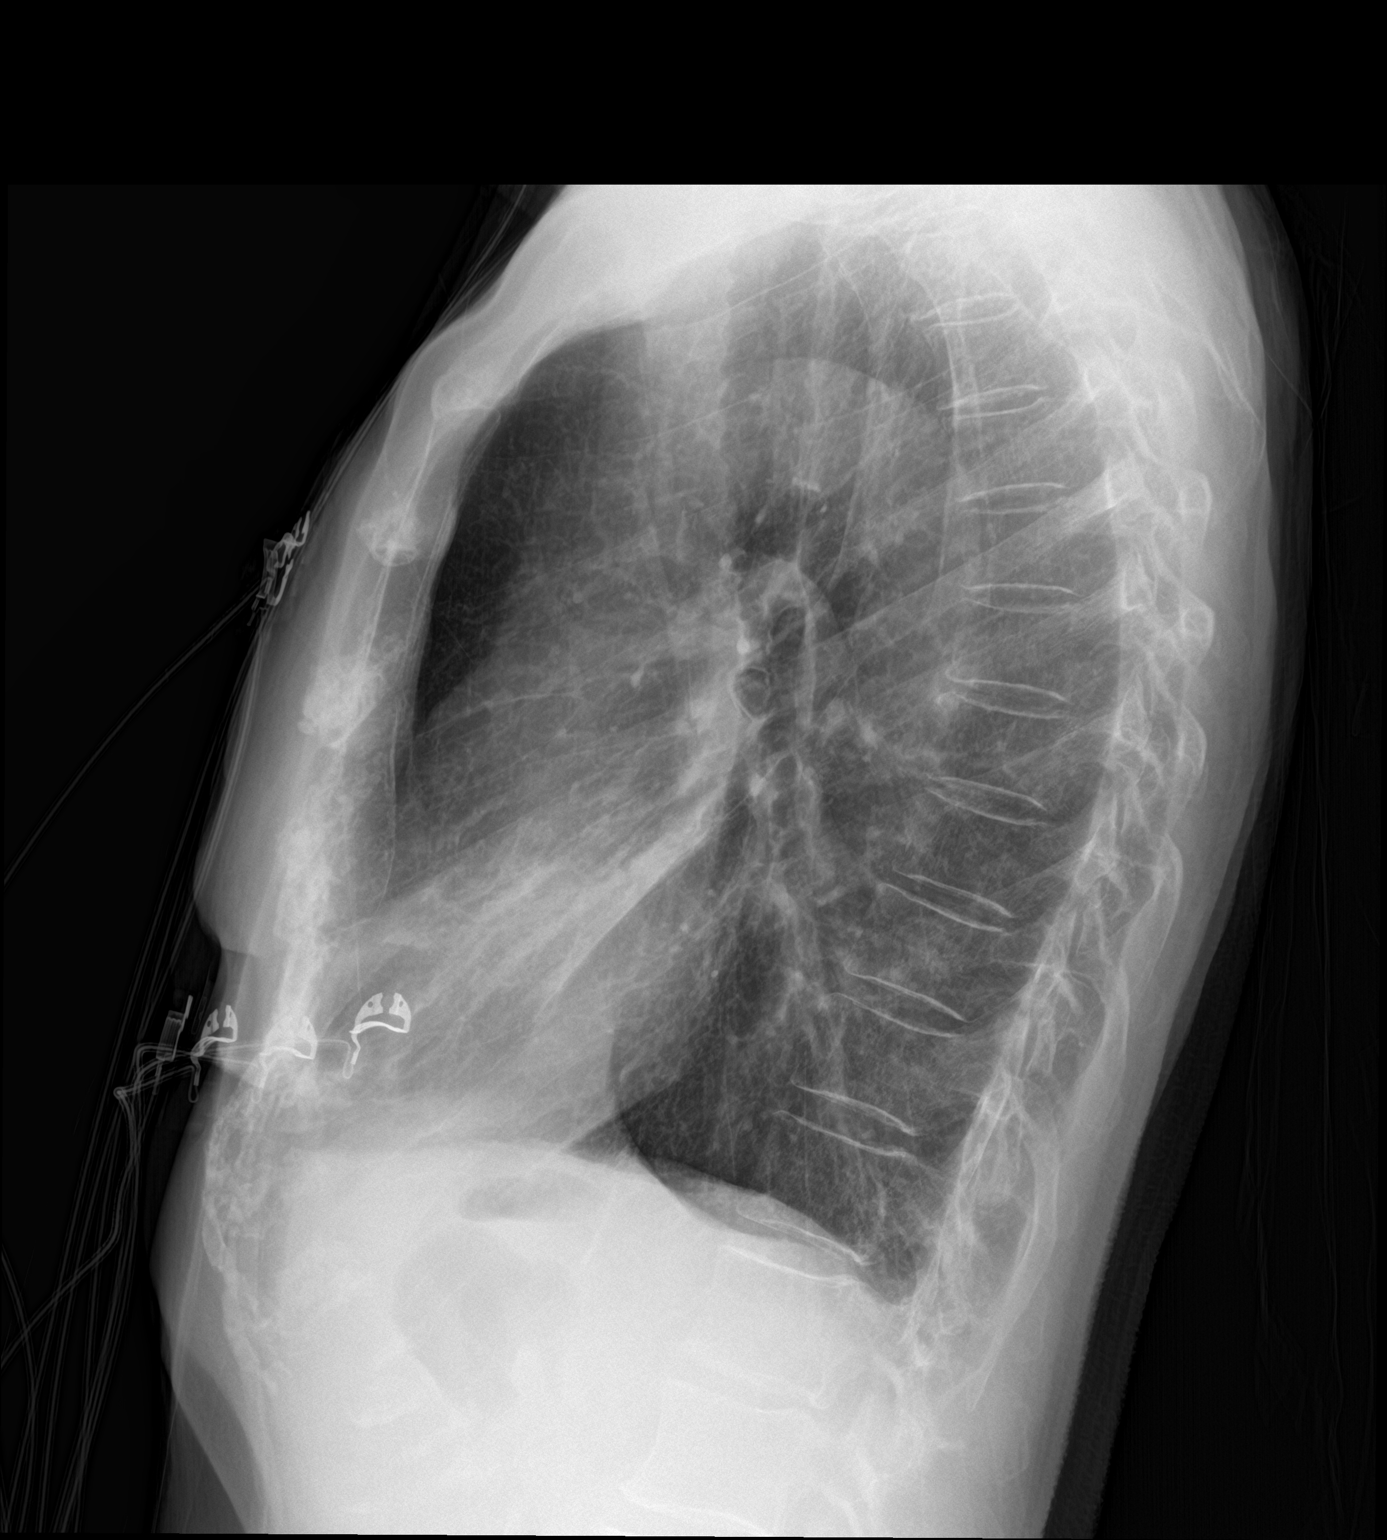

[2 of 2 positions shown; findings below may reference images not displayed]

FINDINGS: Patchy infiltrate in the lingula consistent with pneumonia. No
significant pleural effusion. Right lung is clear

COPD with hyperinflation of the lungs.
IMPRESSION: COPD with pneumonia in the lingula.
